# Patient Record
Sex: Female | Born: 1935 | Race: White | Hispanic: No | Smoking: Former smoker
Health system: Southern US, Community
[De-identification: ages and names within clinical notes are randomized; demographics above are authoritative.]

## PROBLEM LIST (undated history)

## (undated) DIAGNOSIS — I482 Chronic atrial fibrillation, unspecified: Secondary | ICD-10-CM

## (undated) DIAGNOSIS — I609 Nontraumatic subarachnoid hemorrhage, unspecified: Secondary | ICD-10-CM

## (undated) DIAGNOSIS — F0390 Unspecified dementia without behavioral disturbance: Secondary | ICD-10-CM

## (undated) DIAGNOSIS — G919 Hydrocephalus, unspecified: Secondary | ICD-10-CM

## (undated) DIAGNOSIS — I7409 Other arterial embolism and thrombosis of abdominal aorta: Secondary | ICD-10-CM

## (undated) DIAGNOSIS — F419 Anxiety disorder, unspecified: Secondary | ICD-10-CM

## (undated) DIAGNOSIS — Z8679 Personal history of other diseases of the circulatory system: Secondary | ICD-10-CM

## (undated) DIAGNOSIS — Z87891 Personal history of nicotine dependence: Secondary | ICD-10-CM

## (undated) DIAGNOSIS — I340 Nonrheumatic mitral (valve) insufficiency: Secondary | ICD-10-CM

## (undated) DIAGNOSIS — K279 Peptic ulcer, site unspecified, unspecified as acute or chronic, without hemorrhage or perforation: Secondary | ICD-10-CM

## (undated) DIAGNOSIS — I519 Heart disease, unspecified: Secondary | ICD-10-CM

## (undated) DIAGNOSIS — E785 Hyperlipidemia, unspecified: Secondary | ICD-10-CM

## (undated) DIAGNOSIS — I350 Nonrheumatic aortic (valve) stenosis: Secondary | ICD-10-CM

## (undated) DIAGNOSIS — G4700 Insomnia, unspecified: Secondary | ICD-10-CM

## (undated) DIAGNOSIS — R410 Disorientation, unspecified: Secondary | ICD-10-CM

## (undated) DIAGNOSIS — R0989 Other specified symptoms and signs involving the circulatory and respiratory systems: Secondary | ICD-10-CM

## (undated) DIAGNOSIS — Z658 Other specified problems related to psychosocial circumstances: Secondary | ICD-10-CM

## (undated) DIAGNOSIS — I10 Essential (primary) hypertension: Secondary | ICD-10-CM

## (undated) DIAGNOSIS — J302 Other seasonal allergic rhinitis: Secondary | ICD-10-CM

## (undated) DIAGNOSIS — M069 Rheumatoid arthritis, unspecified: Secondary | ICD-10-CM

## (undated) HISTORY — DX: Anxiety disorder, unspecified: F41.9

## (undated) HISTORY — PX: PACEMAKER INSERTION: SHX728

## (undated) HISTORY — DX: Chronic atrial fibrillation, unspecified: I48.20

## (undated) HISTORY — DX: Other specified symptoms and signs involving the circulatory and respiratory systems: R09.89

## (undated) HISTORY — DX: Hydrocephalus, unspecified: G91.9

## (undated) HISTORY — DX: Other specified problems related to psychosocial circumstances: Z65.8

## (undated) HISTORY — PX: CATARACT EXTRACTION, BILATERAL: SHX1313

## (undated) HISTORY — PX: ABDOMINAL HYSTERECTOMY: SHX81

## (undated) HISTORY — DX: Hyperlipidemia, unspecified: E78.5

## (undated) HISTORY — DX: Other arterial embolism and thrombosis of abdominal aorta: I74.09

## (undated) HISTORY — DX: Rheumatoid arthritis, unspecified: M06.9

## (undated) HISTORY — DX: Insomnia, unspecified: G47.00

## (undated) HISTORY — DX: Other seasonal allergic rhinitis: J30.2

## (undated) HISTORY — DX: Personal history of nicotine dependence: Z87.891

## (undated) HISTORY — DX: Personal history of other diseases of the circulatory system: Z86.79

## (undated) HISTORY — DX: Essential (primary) hypertension: I10

## (undated) HISTORY — DX: Unspecified dementia, unspecified severity, without behavioral disturbance, psychotic disturbance, mood disturbance, and anxiety: F03.90

## (undated) HISTORY — DX: Heart disease, unspecified: I51.9

## (undated) HISTORY — DX: Nonrheumatic aortic (valve) stenosis: I35.0

## (undated) HISTORY — DX: Nontraumatic subarachnoid hemorrhage, unspecified: I60.9

## (undated) HISTORY — PX: VENTRICULOPERITONEAL SHUNT: SHX204

## (undated) HISTORY — DX: Peptic ulcer, site unspecified, unspecified as acute or chronic, without hemorrhage or perforation: K27.9

## (undated) HISTORY — DX: Disorientation, unspecified: R41.0

## (undated) HISTORY — DX: Nonrheumatic mitral (valve) insufficiency: I34.0

## (undated) HISTORY — PX: TRACHEOSTOMY CLOSURE: SHX458

---
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
## 1997-12-16 ENCOUNTER — Emergency Department (HOSPITAL_COMMUNITY): Admission: EM | Admit: 1997-12-16 | Discharge: 1997-12-16 | Payer: Self-pay | Admitting: Emergency Medicine

## 1999-03-27 ENCOUNTER — Emergency Department (HOSPITAL_COMMUNITY): Admission: EM | Admit: 1999-03-27 | Discharge: 1999-03-27 | Payer: Self-pay

## 1999-04-08 ENCOUNTER — Emergency Department (HOSPITAL_COMMUNITY): Admission: EM | Admit: 1999-04-08 | Discharge: 1999-04-08 | Payer: Self-pay | Admitting: Emergency Medicine

## 1999-04-08 ENCOUNTER — Encounter: Payer: Self-pay | Admitting: Emergency Medicine

## 1999-05-03 ENCOUNTER — Encounter: Admission: RE | Admit: 1999-05-03 | Discharge: 1999-08-01 | Payer: Self-pay | Admitting: Orthopedic Surgery

## 2000-02-07 ENCOUNTER — Encounter: Payer: Self-pay | Admitting: *Deleted

## 2000-02-07 ENCOUNTER — Inpatient Hospital Stay (HOSPITAL_COMMUNITY): Admission: EM | Admit: 2000-02-07 | Discharge: 2000-02-11 | Payer: Self-pay | Admitting: *Deleted

## 2001-02-26 ENCOUNTER — Encounter: Admission: RE | Admit: 2001-02-26 | Discharge: 2001-02-26 | Payer: Self-pay | Admitting: *Deleted

## 2001-02-26 ENCOUNTER — Encounter: Payer: Self-pay | Admitting: *Deleted

## 2001-02-26 ENCOUNTER — Ambulatory Visit (HOSPITAL_COMMUNITY): Admission: RE | Admit: 2001-02-26 | Discharge: 2001-02-26 | Payer: Self-pay | Admitting: *Deleted

## 2001-03-12 ENCOUNTER — Encounter: Payer: Self-pay | Admitting: *Deleted

## 2001-03-12 ENCOUNTER — Ambulatory Visit (HOSPITAL_COMMUNITY): Admission: RE | Admit: 2001-03-12 | Discharge: 2001-03-12 | Payer: Self-pay | Admitting: *Deleted

## 2001-03-12 ENCOUNTER — Encounter: Admission: RE | Admit: 2001-03-12 | Discharge: 2001-03-12 | Payer: Self-pay | Admitting: *Deleted

## 2001-03-25 ENCOUNTER — Encounter: Payer: Self-pay | Admitting: Internal Medicine

## 2001-03-25 ENCOUNTER — Emergency Department (HOSPITAL_COMMUNITY): Admission: EM | Admit: 2001-03-25 | Discharge: 2001-03-25 | Payer: Self-pay | Admitting: Emergency Medicine

## 2001-03-27 ENCOUNTER — Encounter: Admission: RE | Admit: 2001-03-27 | Discharge: 2001-03-27 | Payer: Self-pay | Admitting: *Deleted

## 2001-03-27 ENCOUNTER — Encounter: Payer: Self-pay | Admitting: *Deleted

## 2001-03-27 ENCOUNTER — Ambulatory Visit (HOSPITAL_COMMUNITY): Admission: RE | Admit: 2001-03-27 | Discharge: 2001-03-27 | Payer: Self-pay | Admitting: *Deleted

## 2001-12-27 ENCOUNTER — Ambulatory Visit (HOSPITAL_COMMUNITY): Admission: RE | Admit: 2001-12-27 | Discharge: 2001-12-27 | Payer: Self-pay | Admitting: Internal Medicine

## 2002-09-10 ENCOUNTER — Encounter: Admission: RE | Admit: 2002-09-10 | Discharge: 2002-09-10 | Payer: Self-pay | Admitting: Internal Medicine

## 2002-09-10 ENCOUNTER — Encounter (HOSPITAL_BASED_OUTPATIENT_CLINIC_OR_DEPARTMENT_OTHER): Payer: Self-pay | Admitting: Internal Medicine

## 2002-09-12 DIAGNOSIS — I609 Nontraumatic subarachnoid hemorrhage, unspecified: Secondary | ICD-10-CM

## 2002-09-12 HISTORY — DX: Nontraumatic subarachnoid hemorrhage, unspecified: I60.9

## 2003-03-13 HISTORY — PX: ANEURYSM COILING: SHX5349

## 2003-04-04 ENCOUNTER — Encounter: Payer: Self-pay | Admitting: Emergency Medicine

## 2003-04-04 ENCOUNTER — Encounter: Payer: Self-pay | Admitting: Neurosurgery

## 2003-04-04 ENCOUNTER — Inpatient Hospital Stay (HOSPITAL_COMMUNITY): Admission: EM | Admit: 2003-04-04 | Discharge: 2003-05-09 | Payer: Self-pay | Admitting: Emergency Medicine

## 2003-04-05 ENCOUNTER — Encounter: Payer: Self-pay | Admitting: Critical Care Medicine

## 2003-04-05 ENCOUNTER — Encounter: Payer: Self-pay | Admitting: Neurosurgery

## 2003-04-06 ENCOUNTER — Encounter: Payer: Self-pay | Admitting: Critical Care Medicine

## 2003-04-06 ENCOUNTER — Encounter: Payer: Self-pay | Admitting: Neurosurgery

## 2003-04-07 ENCOUNTER — Encounter: Payer: Self-pay | Admitting: Critical Care Medicine

## 2003-04-07 ENCOUNTER — Encounter: Payer: Self-pay | Admitting: Neurosurgery

## 2003-04-08 ENCOUNTER — Encounter: Payer: Self-pay | Admitting: Critical Care Medicine

## 2003-04-09 ENCOUNTER — Encounter: Payer: Self-pay | Admitting: Pulmonary Disease

## 2003-04-09 ENCOUNTER — Encounter: Payer: Self-pay | Admitting: Neurosurgery

## 2003-04-09 ENCOUNTER — Encounter: Payer: Self-pay | Admitting: Critical Care Medicine

## 2003-04-10 ENCOUNTER — Encounter: Payer: Self-pay | Admitting: Critical Care Medicine

## 2003-04-10 ENCOUNTER — Encounter: Payer: Self-pay | Admitting: Cardiology

## 2003-04-11 ENCOUNTER — Encounter: Payer: Self-pay | Admitting: Critical Care Medicine

## 2003-04-12 ENCOUNTER — Encounter: Payer: Self-pay | Admitting: Critical Care Medicine

## 2003-04-14 ENCOUNTER — Encounter: Payer: Self-pay | Admitting: Neurosurgery

## 2003-04-15 ENCOUNTER — Encounter: Payer: Self-pay | Admitting: Critical Care Medicine

## 2003-04-15 ENCOUNTER — Encounter: Payer: Self-pay | Admitting: Neurosurgery

## 2003-04-16 ENCOUNTER — Encounter: Payer: Self-pay | Admitting: Critical Care Medicine

## 2003-04-16 ENCOUNTER — Encounter: Payer: Self-pay | Admitting: Neurosurgery

## 2003-04-17 ENCOUNTER — Encounter: Payer: Self-pay | Admitting: Critical Care Medicine

## 2003-04-18 ENCOUNTER — Encounter: Payer: Self-pay | Admitting: Neurosurgery

## 2003-04-18 ENCOUNTER — Encounter: Payer: Self-pay | Admitting: Critical Care Medicine

## 2003-04-19 ENCOUNTER — Encounter: Payer: Self-pay | Admitting: Critical Care Medicine

## 2003-04-20 ENCOUNTER — Encounter: Payer: Self-pay | Admitting: Neurosurgery

## 2003-04-22 ENCOUNTER — Encounter: Payer: Self-pay | Admitting: Neurosurgery

## 2003-04-23 ENCOUNTER — Encounter: Payer: Self-pay | Admitting: Neurosurgery

## 2003-04-27 ENCOUNTER — Encounter: Payer: Self-pay | Admitting: Neurosurgery

## 2003-04-30 ENCOUNTER — Encounter: Payer: Self-pay | Admitting: Neurosurgery

## 2003-05-05 ENCOUNTER — Encounter: Payer: Self-pay | Admitting: Neurosurgery

## 2003-05-09 ENCOUNTER — Inpatient Hospital Stay (HOSPITAL_COMMUNITY)
Admission: RE | Admit: 2003-05-09 | Discharge: 2003-05-21 | Payer: Self-pay | Admitting: Physical Medicine & Rehabilitation

## 2003-05-24 ENCOUNTER — Encounter: Payer: Self-pay | Admitting: Emergency Medicine

## 2003-05-24 ENCOUNTER — Encounter: Payer: Self-pay | Admitting: Internal Medicine

## 2003-05-24 ENCOUNTER — Emergency Department (HOSPITAL_COMMUNITY): Admission: AD | Admit: 2003-05-24 | Discharge: 2003-05-24 | Payer: Self-pay | Admitting: Emergency Medicine

## 2003-06-17 ENCOUNTER — Encounter: Payer: Self-pay | Admitting: Neurosurgery

## 2003-06-17 ENCOUNTER — Ambulatory Visit (HOSPITAL_COMMUNITY): Admission: RE | Admit: 2003-06-17 | Discharge: 2003-06-17 | Payer: Self-pay | Admitting: Neurosurgery

## 2003-06-20 ENCOUNTER — Ambulatory Visit (HOSPITAL_COMMUNITY): Admission: RE | Admit: 2003-06-20 | Discharge: 2003-06-20 | Payer: Self-pay | Admitting: Neurosurgery

## 2003-06-20 ENCOUNTER — Encounter: Payer: Self-pay | Admitting: Neurosurgery

## 2003-07-01 ENCOUNTER — Encounter: Payer: Self-pay | Admitting: Neurosurgery

## 2003-07-03 ENCOUNTER — Inpatient Hospital Stay (HOSPITAL_COMMUNITY): Admission: RE | Admit: 2003-07-03 | Discharge: 2003-07-07 | Payer: Self-pay | Admitting: Neurosurgery

## 2003-07-03 ENCOUNTER — Encounter: Payer: Self-pay | Admitting: Neurosurgery

## 2003-07-04 ENCOUNTER — Encounter: Payer: Self-pay | Admitting: Neurosurgery

## 2003-07-07 ENCOUNTER — Encounter: Payer: Self-pay | Admitting: Neurosurgery

## 2003-07-17 ENCOUNTER — Ambulatory Visit (HOSPITAL_COMMUNITY): Admission: RE | Admit: 2003-07-17 | Discharge: 2003-07-17 | Payer: Self-pay | Admitting: Neurosurgery

## 2003-07-25 ENCOUNTER — Ambulatory Visit (HOSPITAL_COMMUNITY): Admission: RE | Admit: 2003-07-25 | Discharge: 2003-07-25 | Payer: Self-pay | Admitting: Neurosurgery

## 2003-08-01 ENCOUNTER — Ambulatory Visit (HOSPITAL_COMMUNITY): Admission: RE | Admit: 2003-08-01 | Discharge: 2003-08-01 | Payer: Self-pay | Admitting: Neurosurgery

## 2003-08-12 ENCOUNTER — Ambulatory Visit (HOSPITAL_COMMUNITY): Admission: RE | Admit: 2003-08-12 | Discharge: 2003-08-12 | Payer: Self-pay | Admitting: Neurosurgery

## 2003-09-19 ENCOUNTER — Ambulatory Visit (HOSPITAL_COMMUNITY): Admission: RE | Admit: 2003-09-19 | Discharge: 2003-09-19 | Payer: Self-pay | Admitting: Neurosurgery

## 2003-12-16 ENCOUNTER — Ambulatory Visit (HOSPITAL_COMMUNITY): Admission: RE | Admit: 2003-12-16 | Discharge: 2003-12-16 | Payer: Self-pay | Admitting: Neurosurgery

## 2007-05-21 ENCOUNTER — Emergency Department (HOSPITAL_COMMUNITY): Admission: EM | Admit: 2007-05-21 | Discharge: 2007-05-21 | Payer: Self-pay | Admitting: Emergency Medicine

## 2007-08-21 ENCOUNTER — Encounter: Admission: RE | Admit: 2007-08-21 | Discharge: 2007-08-21 | Payer: Self-pay | Admitting: Internal Medicine

## 2007-09-08 ENCOUNTER — Encounter: Payer: Self-pay | Admitting: Emergency Medicine

## 2007-09-08 ENCOUNTER — Inpatient Hospital Stay (HOSPITAL_COMMUNITY): Admission: EM | Admit: 2007-09-08 | Discharge: 2007-09-14 | Payer: Self-pay | Admitting: Emergency Medicine

## 2007-10-04 ENCOUNTER — Encounter: Admission: RE | Admit: 2007-10-04 | Discharge: 2007-10-04 | Payer: Self-pay | Admitting: Neurosurgery

## 2008-04-15 ENCOUNTER — Ambulatory Visit: Payer: Self-pay | Admitting: Vascular Surgery

## 2008-04-20 ENCOUNTER — Inpatient Hospital Stay (HOSPITAL_COMMUNITY): Admission: EM | Admit: 2008-04-20 | Discharge: 2008-04-23 | Payer: Self-pay | Admitting: Emergency Medicine

## 2008-04-22 ENCOUNTER — Ambulatory Visit: Payer: Self-pay | Admitting: Vascular Surgery

## 2008-04-22 ENCOUNTER — Encounter (HOSPITAL_BASED_OUTPATIENT_CLINIC_OR_DEPARTMENT_OTHER): Payer: Self-pay | Admitting: Internal Medicine

## 2008-05-13 HISTORY — PX: HEMIARTHROPLASTY HIP: SUR652

## 2008-05-25 ENCOUNTER — Inpatient Hospital Stay (HOSPITAL_COMMUNITY): Admission: EM | Admit: 2008-05-25 | Discharge: 2008-06-02 | Payer: Self-pay | Admitting: Emergency Medicine

## 2008-07-12 ENCOUNTER — Inpatient Hospital Stay (HOSPITAL_COMMUNITY): Admission: EM | Admit: 2008-07-12 | Discharge: 2008-07-17 | Payer: Self-pay | Admitting: Emergency Medicine

## 2008-07-12 ENCOUNTER — Ambulatory Visit: Payer: Self-pay | Admitting: Cardiology

## 2008-09-23 ENCOUNTER — Encounter: Admission: RE | Admit: 2008-09-23 | Discharge: 2008-12-22 | Payer: Self-pay | Admitting: Orthopaedic Surgery

## 2008-09-29 ENCOUNTER — Inpatient Hospital Stay (HOSPITAL_COMMUNITY): Admission: EM | Admit: 2008-09-29 | Discharge: 2008-10-10 | Payer: Self-pay | Admitting: Emergency Medicine

## 2008-10-03 ENCOUNTER — Encounter: Payer: Self-pay | Admitting: Gastroenterology

## 2008-10-03 ENCOUNTER — Encounter (HOSPITAL_BASED_OUTPATIENT_CLINIC_OR_DEPARTMENT_OTHER): Payer: Self-pay | Admitting: Internal Medicine

## 2008-12-29 IMAGING — CR DG HIP (WITH OR WITHOUT PELVIS) 2-3V*L*
4 series · 4 of 4 positions shown · non-contrast
Comparison: 

CLINICAL DATA: Fall

LEFT HIP - COMPLETE 2+ VIEW

[t pelvis a.p.]
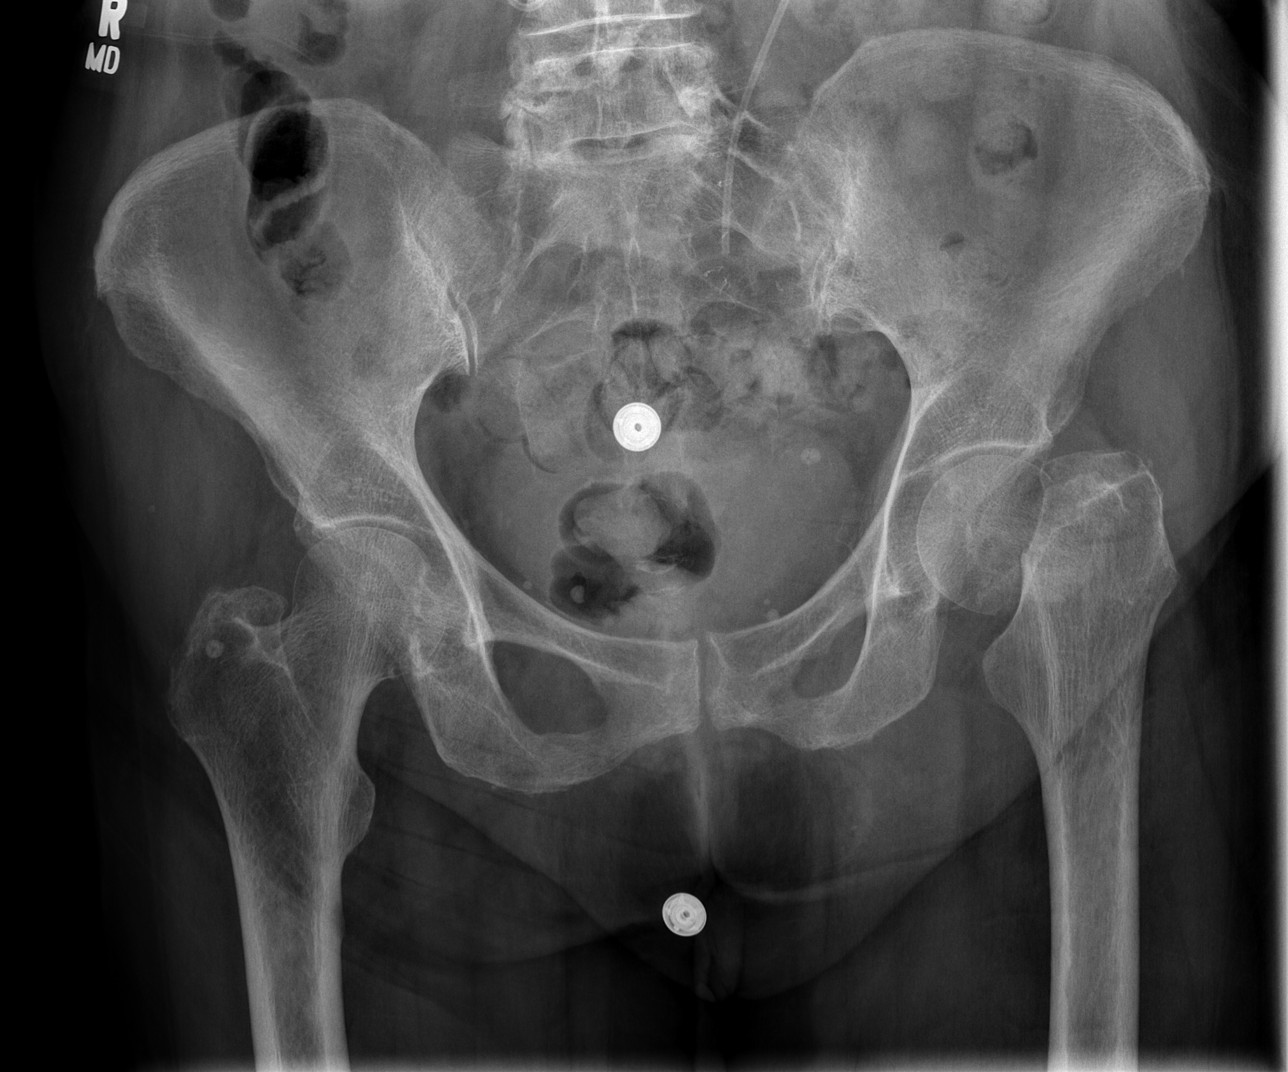

[t hip ap left]
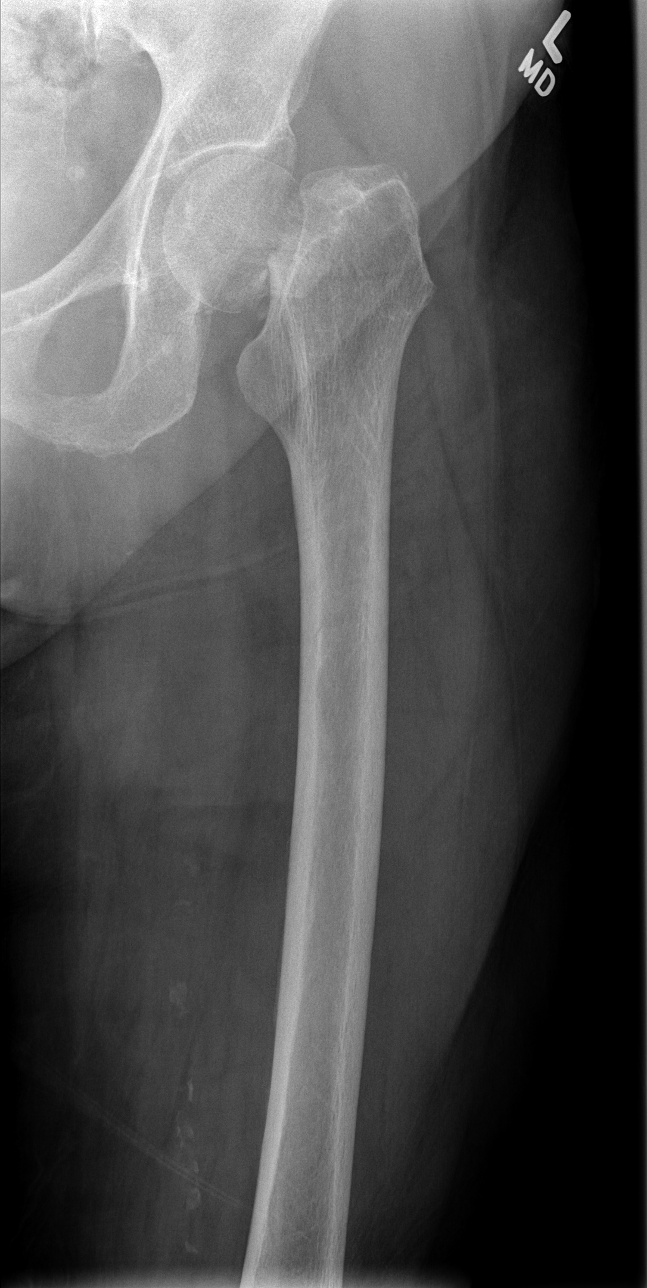

[w hip lateral left * (1 of 2)]
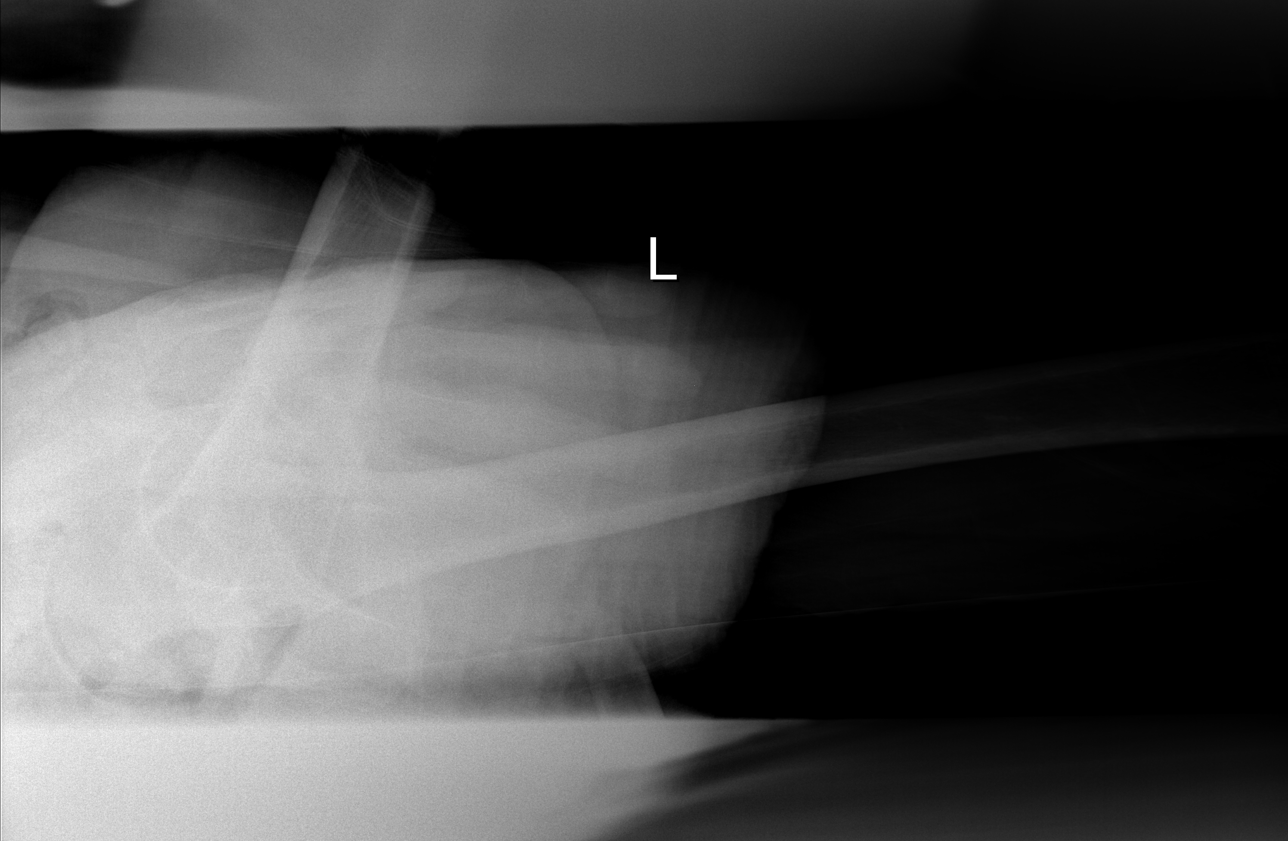

[w hip lateral left * (2 of 2)]
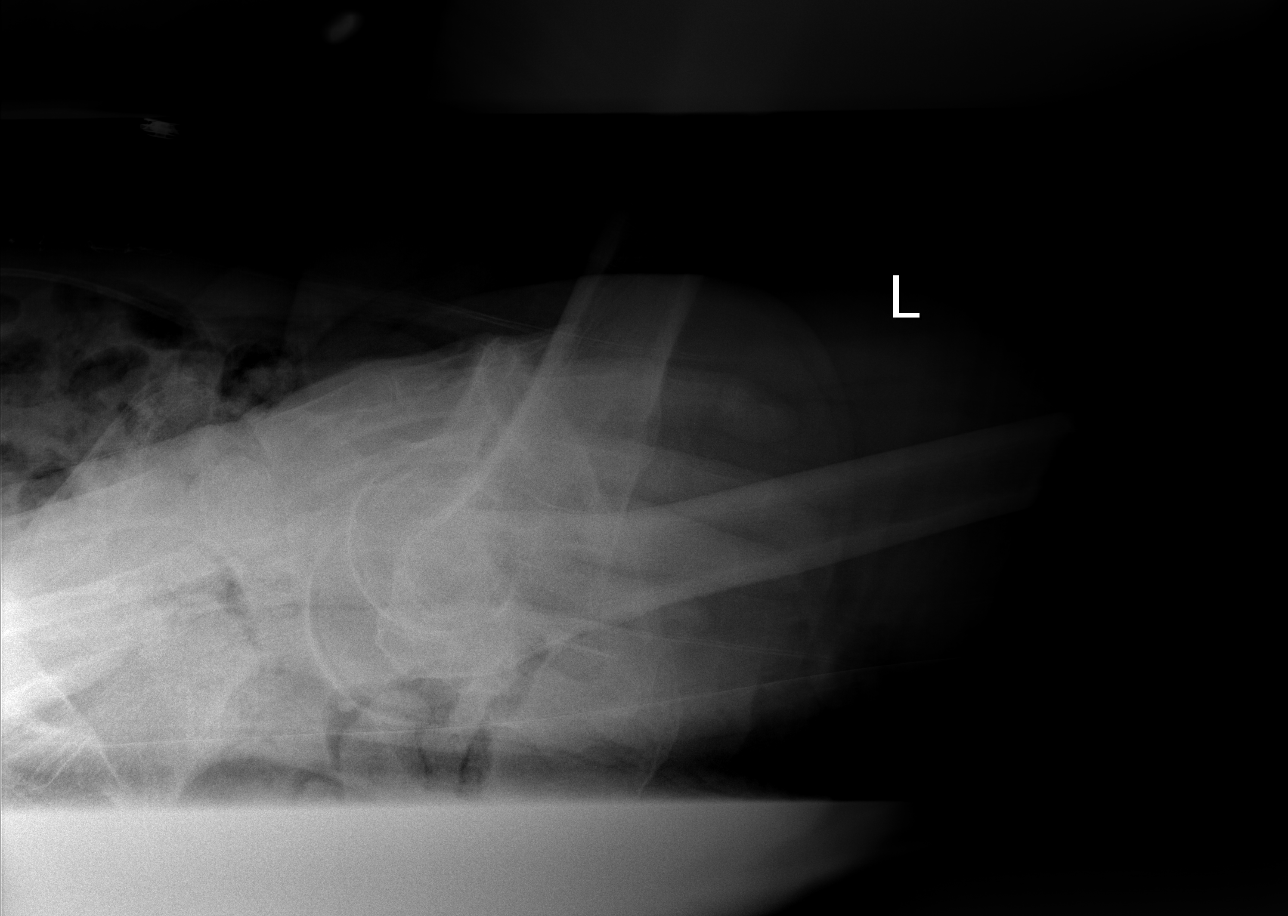

[4 of 4 positions shown; findings below may reference images not displayed]

FINDINGS: Left femoral neck fracture with varus deformity is noted.
The right femoral neck is intact.  Bones are mildly demineralized.
There is anatomic alignment of the femoral head with respect to the
acetabulum.
IMPRESSION: Acute left femoral neck fracture.

## 2009-05-24 ENCOUNTER — Inpatient Hospital Stay (HOSPITAL_COMMUNITY): Admission: EM | Admit: 2009-05-24 | Discharge: 2009-05-29 | Payer: Self-pay | Admitting: Emergency Medicine

## 2009-05-29 ENCOUNTER — Encounter (HOSPITAL_BASED_OUTPATIENT_CLINIC_OR_DEPARTMENT_OTHER): Payer: Self-pay | Admitting: Internal Medicine

## 2010-03-14 ENCOUNTER — Emergency Department (HOSPITAL_COMMUNITY): Admission: EM | Admit: 2010-03-14 | Discharge: 2010-03-14 | Payer: Self-pay | Admitting: Emergency Medicine

## 2010-12-17 LAB — COMPREHENSIVE METABOLIC PANEL
ALT: 9 U/L (ref 0–35)
ALT: 9 U/L (ref 0–35)
ALT: 9 U/L (ref 0–35)
AST: 14 U/L (ref 0–37)
AST: 20 U/L (ref 0–37)
AST: 24 U/L (ref 0–37)
Albumin: 3 g/dL — ABNORMAL LOW (ref 3.5–5.2)
Albumin: 3.8 g/dL (ref 3.5–5.2)
Alkaline Phosphatase: 67 U/L (ref 39–117)
BUN: 13 mg/dL (ref 6–23)
CO2: 28 mEq/L (ref 19–32)
CO2: 29 mEq/L (ref 19–32)
CO2: 30 mEq/L (ref 19–32)
Calcium: 8.9 mg/dL (ref 8.4–10.5)
Calcium: 9.2 mg/dL (ref 8.4–10.5)
Chloride: 100 mEq/L (ref 96–112)
Chloride: 101 mEq/L (ref 96–112)
Creatinine, Ser: 1.21 mg/dL — ABNORMAL HIGH (ref 0.4–1.2)
Creatinine, Ser: 1.26 mg/dL — ABNORMAL HIGH (ref 0.4–1.2)
GFR calc Af Amer: 45 mL/min — ABNORMAL LOW (ref >60)
GFR calc Af Amer: 47 mL/min — ABNORMAL LOW (ref >60)
GFR calc Af Amer: 49 mL/min — ABNORMAL LOW (ref >60)
GFR calc Af Amer: 51 mL/min — ABNORMAL LOW (ref >60)
GFR calc non Af Amer: 37 mL/min — ABNORMAL LOW (ref >60)
GFR calc non Af Amer: 39 mL/min — ABNORMAL LOW (ref >60)
Glucose, Bld: 83 mg/dL (ref 70–99)
Potassium: 3.4 mEq/L — ABNORMAL LOW (ref 3.5–5.1)
Potassium: 3.5 mEq/L (ref 3.5–5.1)
Sodium: 137 mEq/L (ref 135–145)
Sodium: 137 mEq/L (ref 135–145)
Sodium: 138 mEq/L (ref 135–145)
Sodium: 142 mEq/L (ref 135–145)
Total Bilirubin: 1.3 mg/dL — ABNORMAL HIGH (ref 0.3–1.2)
Total Bilirubin: 1.6 mg/dL — ABNORMAL HIGH (ref 0.3–1.2)
Total Protein: 6.4 g/dL (ref 6.0–8.3)

## 2010-12-17 LAB — CBC
HCT: 42.3 % (ref 36.0–46.0)
Hemoglobin: 14.1 g/dL (ref 12.0–15.0)
Hemoglobin: 12.9 g/dL (ref 12.0–15.0)
MCHC: 33.3 g/dL (ref 30.0–36.0)
MCHC: 33 g/dL (ref 30.0–36.0)
MCV: 93.3 fL (ref 78.0–100.0)
MCV: 94.4 fL (ref 78.0–100.0)
Platelets: 217 10*3/uL (ref 150–400)
Platelets: 250 10*3/uL (ref 150–400)
RBC: 4.06 MIL/uL (ref 3.87–5.11)
RBC: 4.08 MIL/uL (ref 3.87–5.11)
RBC: 4.18 MIL/uL (ref 3.87–5.11)
RBC: 4.81 MIL/uL (ref 3.87–5.11)
RDW: 14.1 % (ref 11.5–15.5)
WBC: 11.1 10*3/uL — ABNORMAL HIGH (ref 4.0–10.5)
WBC: 11.1 10*3/uL — ABNORMAL HIGH (ref 4.0–10.5)
WBC: 7.4 10*3/uL (ref 4.0–10.5)
WBC: 7.6 10*3/uL (ref 4.0–10.5)

## 2010-12-17 LAB — DIFFERENTIAL
Basophils Absolute: 0 10*3/uL (ref 0.0–0.1)
Basophils Absolute: 0 10*3/uL (ref 0.0–0.1)
Basophils Absolute: 0.1 10*3/uL (ref 0.0–0.1)
Basophils Relative: 0 % (ref 0–1)
Basophils Relative: 0 % (ref 0–1)
Eosinophils Absolute: 0 10*3/uL (ref 0.0–0.7)
Eosinophils Absolute: 0 10*3/uL (ref 0.0–0.7)
Eosinophils Absolute: 0.1 10*3/uL (ref 0.0–0.7)
Eosinophils Relative: 0 % (ref 0–5)
Eosinophils Relative: 0 % (ref 0–5)
Eosinophils Relative: 0 % (ref 0–5)
Eosinophils Relative: 1 % (ref 0–5)
Lymphocytes Relative: 8 % — ABNORMAL LOW (ref 12–46)
Lymphocytes Relative: 21 % (ref 12–46)
Lymphocytes Relative: 8 % — ABNORMAL LOW (ref 12–46)
Lymphs Abs: 1.4 10*3/uL (ref 0.7–4.0)
Lymphs Abs: 1.6 10*3/uL (ref 0.7–4.0)
Monocytes Absolute: 0.2 10*3/uL (ref 0.1–1.0)
Monocytes Absolute: 0.4 10*3/uL (ref 0.1–1.0)
Monocytes Absolute: 0.8 10*3/uL (ref 0.1–1.0)
Monocytes Relative: 11 % (ref 3–12)
Monocytes Relative: 12 % (ref 3–12)
Neutro Abs: 7.6 10*3/uL (ref 1.7–7.7)
Neutro Abs: 4.7 10*3/uL (ref 1.7–7.7)
Neutrophils Relative %: 64 % (ref 43–77)

## 2010-12-17 LAB — URINALYSIS, ROUTINE W REFLEX MICROSCOPIC
Bilirubin Urine: NEGATIVE
Hgb urine dipstick: NEGATIVE
Nitrite: NEGATIVE
Protein, ur: 100 mg/dL — AB
Urobilinogen, UA: 1 mg/dL (ref 0.0–1.0)

## 2010-12-17 LAB — URINE CULTURE
Colony Count: NO GROWTH
Culture: NO GROWTH

## 2010-12-17 LAB — TROPONIN I: Troponin I: 0.07 ng/mL — ABNORMAL HIGH (ref 0.00–0.06)

## 2010-12-17 LAB — CARDIAC PANEL(CRET KIN+CKTOT+MB+TROPI)
CK, MB: 2 ng/mL (ref 0.3–4.0)
Relative Index: INVALID (ref 0.0–2.5)
Total CK: 37 U/L (ref 7–177)

## 2010-12-17 LAB — APTT: aPTT: 30 seconds (ref 24–37)

## 2010-12-17 LAB — BRAIN NATRIURETIC PEPTIDE
Pro B Natriuretic peptide (BNP): 1481 pg/mL — ABNORMAL HIGH (ref 0.0–100.0)
Pro B Natriuretic peptide (BNP): 1856 pg/mL — ABNORMAL HIGH (ref 0.0–100.0)

## 2010-12-27 LAB — DIFFERENTIAL
Basophils Absolute: 0 10*3/uL (ref 0.0–0.1)
Basophils Absolute: 0 10*3/uL (ref 0.0–0.1)
Basophils Absolute: 0 10*3/uL (ref 0.0–0.1)
Basophils Relative: 0 % (ref 0–1)
Basophils Relative: 0 % (ref 0–1)
Basophils Relative: 1 % (ref 0–1)
Eosinophils Absolute: 0 10*3/uL (ref 0.0–0.7)
Eosinophils Absolute: 0.1 10*3/uL (ref 0.0–0.7)
Eosinophils Absolute: 0.1 10*3/uL (ref 0.0–0.7)
Eosinophils Absolute: 0.1 10*3/uL (ref 0.0–0.7)
Eosinophils Absolute: 0.1 10*3/uL (ref 0.0–0.7)
Eosinophils Relative: 1 % (ref 0–5)
Eosinophils Relative: 1 % (ref 0–5)
Eosinophils Relative: 2 % (ref 0–5)
Eosinophils Relative: 2 % (ref 0–5)
Lymphocytes Relative: 11 % — ABNORMAL LOW (ref 12–46)
Lymphocytes Relative: 14 % (ref 12–46)
Lymphocytes Relative: 15 % (ref 12–46)
Lymphocytes Relative: 17 % (ref 12–46)
Lymphocytes Relative: 18 % (ref 12–46)
Lymphs Abs: 1 10*3/uL (ref 0.7–4.0)
Lymphs Abs: 1 10*3/uL (ref 0.7–4.0)
Lymphs Abs: 1.3 10*3/uL (ref 0.7–4.0)
Lymphs Abs: 1.5 10*3/uL (ref 0.7–4.0)
Lymphs Abs: 1.6 10*3/uL (ref 0.7–4.0)
Monocytes Absolute: 0.8 10*3/uL (ref 0.1–1.0)
Monocytes Absolute: 0.9 10*3/uL (ref 0.1–1.0)
Monocytes Relative: 11 % (ref 3–12)
Monocytes Relative: 11 % (ref 3–12)
Neutro Abs: 4.5 10*3/uL (ref 1.7–7.7)
Neutro Abs: 4.9 10*3/uL (ref 1.7–7.7)
Neutro Abs: 6.5 10*3/uL (ref 1.7–7.7)
Neutrophils Relative %: 67 % (ref 43–77)
Neutrophils Relative %: 69 % (ref 43–77)
Neutrophils Relative %: 72 % (ref 43–77)
Neutrophils Relative %: 75 % (ref 43–77)
Neutrophils Relative %: 79 % — ABNORMAL HIGH (ref 43–77)

## 2010-12-27 LAB — COMPREHENSIVE METABOLIC PANEL
ALT: 10 U/L (ref 0–35)
ALT: 13 U/L (ref 0–35)
ALT: 14 U/L (ref 0–35)
ALT: 8 U/L (ref 0–35)
ALT: 8 U/L (ref 0–35)
AST: 11 U/L (ref 0–37)
AST: 13 U/L (ref 0–37)
AST: 17 U/L (ref 0–37)
AST: 20 U/L (ref 0–37)
Albumin: 2.8 g/dL — ABNORMAL LOW (ref 3.5–5.2)
Albumin: 2.9 g/dL — ABNORMAL LOW (ref 3.5–5.2)
Alkaline Phosphatase: 59 U/L (ref 39–117)
Alkaline Phosphatase: 72 U/L (ref 39–117)
Alkaline Phosphatase: 73 U/L (ref 39–117)
BUN: 38 mg/dL — ABNORMAL HIGH (ref 6–23)
BUN: 41 mg/dL — ABNORMAL HIGH (ref 6–23)
BUN: 46 mg/dL — ABNORMAL HIGH (ref 6–23)
CO2: 23 mEq/L (ref 19–32)
CO2: 24 mEq/L (ref 19–32)
CO2: 25 mEq/L (ref 19–32)
CO2: 26 mEq/L (ref 19–32)
CO2: 27 mEq/L (ref 19–32)
Calcium: 8.4 mg/dL (ref 8.4–10.5)
Calcium: 8.6 mg/dL (ref 8.4–10.5)
Calcium: 8.7 mg/dL (ref 8.4–10.5)
Calcium: 8.7 mg/dL (ref 8.4–10.5)
Calcium: 9 mg/dL (ref 8.4–10.5)
Chloride: 100 mEq/L (ref 96–112)
Chloride: 103 mEq/L (ref 96–112)
Chloride: 103 mEq/L (ref 96–112)
Chloride: 104 mEq/L (ref 96–112)
Chloride: 106 mEq/L (ref 96–112)
Creatinine, Ser: 3.26 mg/dL — ABNORMAL HIGH (ref 0.4–1.2)
Creatinine, Ser: 3.35 mg/dL — ABNORMAL HIGH (ref 0.4–1.2)
GFR calc Af Amer: 13 mL/min — ABNORMAL LOW (ref >60)
GFR calc Af Amer: 15 mL/min — ABNORMAL LOW (ref >60)
GFR calc Af Amer: 16 mL/min — ABNORMAL LOW (ref >60)
GFR calc Af Amer: 19 mL/min — ABNORMAL LOW (ref >60)
GFR calc non Af Amer: 11 mL/min — ABNORMAL LOW (ref >60)
GFR calc non Af Amer: 12 mL/min — ABNORMAL LOW (ref >60)
GFR calc non Af Amer: 13 mL/min — ABNORMAL LOW (ref >60)
GFR calc non Af Amer: 15 mL/min — ABNORMAL LOW (ref >60)
GFR calc non Af Amer: 16 mL/min — ABNORMAL LOW (ref >60)
Glucose, Bld: 100 mg/dL — ABNORMAL HIGH (ref 70–99)
Glucose, Bld: 103 mg/dL — ABNORMAL HIGH (ref 70–99)
Glucose, Bld: 90 mg/dL (ref 70–99)
Potassium: 3.5 mEq/L (ref 3.5–5.1)
Potassium: 3.5 mEq/L (ref 3.5–5.1)
Potassium: 3.7 mEq/L (ref 3.5–5.1)
Sodium: 135 mEq/L (ref 135–145)
Sodium: 138 mEq/L (ref 135–145)
Sodium: 138 mEq/L (ref 135–145)
Sodium: 140 mEq/L (ref 135–145)
Sodium: 140 mEq/L (ref 135–145)
Total Bilirubin: 0.6 mg/dL (ref 0.3–1.2)
Total Bilirubin: 0.8 mg/dL (ref 0.3–1.2)
Total Bilirubin: 1 mg/dL (ref 0.3–1.2)
Total Protein: 5.8 g/dL — ABNORMAL LOW (ref 6.0–8.3)
Total Protein: 6 g/dL (ref 6.0–8.3)

## 2010-12-27 LAB — CBC
HCT: 32.2 % — ABNORMAL LOW (ref 36.0–46.0)
HCT: 33.1 % — ABNORMAL LOW (ref 36.0–46.0)
HCT: 36.6 % (ref 36.0–46.0)
Hemoglobin: 10.3 g/dL — ABNORMAL LOW (ref 12.0–15.0)
Hemoglobin: 10.6 g/dL — ABNORMAL LOW (ref 12.0–15.0)
Hemoglobin: 11 g/dL — ABNORMAL LOW (ref 12.0–15.0)
Hemoglobin: 11.7 g/dL — ABNORMAL LOW (ref 12.0–15.0)
MCHC: 32.5 g/dL (ref 30.0–36.0)
MCHC: 32.6 g/dL (ref 30.0–36.0)
MCHC: 32.9 g/dL (ref 30.0–36.0)
MCHC: 33 g/dL (ref 30.0–36.0)
MCHC: 33 g/dL (ref 30.0–36.0)
MCHC: 33.4 g/dL (ref 30.0–36.0)
MCV: 96.3 fL (ref 78.0–100.0)
MCV: 97.1 fL (ref 78.0–100.0)
MCV: 98.2 fL (ref 78.0–100.0)
Platelets: 275 10*3/uL (ref 150–400)
Platelets: 283 10*3/uL (ref 150–400)
Platelets: 290 10*3/uL (ref 150–400)
Platelets: 324 10*3/uL (ref 150–400)
Platelets: 355 10*3/uL (ref 150–400)
RBC: 3.22 MIL/uL — ABNORMAL LOW (ref 3.87–5.11)
RBC: 3.35 MIL/uL — ABNORMAL LOW (ref 3.87–5.11)
RBC: 3.49 MIL/uL — ABNORMAL LOW (ref 3.87–5.11)
RBC: 3.56 MIL/uL — ABNORMAL LOW (ref 3.87–5.11)
RBC: 3.8 MIL/uL — ABNORMAL LOW (ref 3.87–5.11)
RDW: 14.6 % (ref 11.5–15.5)
RDW: 14.8 % (ref 11.5–15.5)
RDW: 15 % (ref 11.5–15.5)
WBC: 6.4 10*3/uL (ref 4.0–10.5)
WBC: 7 10*3/uL (ref 4.0–10.5)
WBC: 7.1 10*3/uL (ref 4.0–10.5)
WBC: 8.3 10*3/uL (ref 4.0–10.5)
WBC: 8.3 10*3/uL (ref 4.0–10.5)
WBC: 8.3 10*3/uL (ref 4.0–10.5)
WBC: 8.7 10*3/uL (ref 4.0–10.5)

## 2010-12-27 LAB — BASIC METABOLIC PANEL
BUN: 36 mg/dL — ABNORMAL HIGH (ref 6–23)
BUN: 36 mg/dL — ABNORMAL HIGH (ref 6–23)
BUN: 36 mg/dL — ABNORMAL HIGH (ref 6–23)
Calcium: 8.8 mg/dL (ref 8.4–10.5)
Chloride: 104 mEq/L (ref 96–112)
Creatinine, Ser: 3.54 mg/dL — ABNORMAL HIGH (ref 0.4–1.2)
Creatinine, Ser: 4.05 mg/dL — ABNORMAL HIGH (ref 0.4–1.2)
GFR calc non Af Amer: 10 mL/min — ABNORMAL LOW (ref >60)
GFR calc non Af Amer: 11 mL/min — ABNORMAL LOW (ref >60)
GFR calc non Af Amer: 12 mL/min — ABNORMAL LOW (ref >60)
Potassium: 4 mEq/L (ref 3.5–5.1)
Potassium: 4.3 mEq/L (ref 3.5–5.1)

## 2010-12-27 LAB — RENAL FUNCTION PANEL
Albumin: 2.6 g/dL — ABNORMAL LOW (ref 3.5–5.2)
Albumin: 2.8 g/dL — ABNORMAL LOW (ref 3.5–5.2)
BUN: 40 mg/dL — ABNORMAL HIGH (ref 6–23)
Calcium: 8.8 mg/dL (ref 8.4–10.5)
Chloride: 108 mEq/L (ref 96–112)
Creatinine, Ser: 4.13 mg/dL — ABNORMAL HIGH (ref 0.4–1.2)
GFR calc Af Amer: 12 mL/min — ABNORMAL LOW (ref >60)
GFR calc non Af Amer: 10 mL/min — ABNORMAL LOW (ref >60)
Phosphorus: 4.9 mg/dL — ABNORMAL HIGH (ref 2.3–4.6)
Phosphorus: 5.1 mg/dL — ABNORMAL HIGH (ref 2.3–4.6)
Potassium: 3.5 mEq/L (ref 3.5–5.1)
Potassium: 4.3 mEq/L (ref 3.5–5.1)
Sodium: 140 mEq/L (ref 135–145)

## 2010-12-27 LAB — URINALYSIS, ROUTINE W REFLEX MICROSCOPIC
Glucose, UA: NEGATIVE mg/dL
Protein, ur: 30 mg/dL — AB
Specific Gravity, Urine: 1.009 (ref 1.005–1.030)

## 2010-12-27 LAB — PROTEIN, URINE, 24 HOUR
Collection Interval-UPROT: 24 hours
Protein, 24H Urine: 144 mg/d — ABNORMAL HIGH (ref 50–100)

## 2010-12-27 LAB — PROTEIN ELECTROPH W RFLX QUANT IMMUNOGLOBULINS
Albumin ELP: 47.1 % — ABNORMAL LOW (ref 55.8–66.1)
Beta 2: 5.7 % (ref 3.2–6.5)
Gamma Globulin: 19.1 % — ABNORMAL HIGH (ref 11.1–18.8)
M-Spike, %: NOT DETECTED g/dL

## 2010-12-27 LAB — POCT CARDIAC MARKERS
CKMB, poc: 3.1 ng/mL (ref 1.0–8.0)
Myoglobin, poc: 407 ng/mL (ref 12–200)
Troponin i, poc: <0.05 ng/mL (ref 0.00–0.09)

## 2010-12-27 LAB — URINE CULTURE

## 2010-12-27 LAB — URINE MICROSCOPIC-ADD ON

## 2010-12-27 LAB — BRAIN NATRIURETIC PEPTIDE
Pro B Natriuretic peptide (BNP): 1150 pg/mL — ABNORMAL HIGH (ref 0.0–100.0)
Pro B Natriuretic peptide (BNP): 2080 pg/mL — ABNORMAL HIGH (ref 0.0–100.0)
Pro B Natriuretic peptide (BNP): 3190 pg/mL — ABNORMAL HIGH (ref 0.0–100.0)

## 2010-12-27 LAB — AMMONIA: Ammonia: 17 umol/L (ref 11–35)

## 2011-01-25 NOTE — Consult Note (Signed)
[name], [name]               ACCOUNT NO.:  [id number]   MEDICAL RECORD NO.:  [id number]          PATIENT TYPE:  INP   LOCATION:  1512                         FACILITY:  [facility]   PHYSICIAN:  Antonietta Breach, M.D.  DATE OF BIRTH:  24-Aug-1921   DATE OF CONSULTATION:  10/03/2008  DATE OF DISCHARGE:                                 CONSULTATION   FOLLOWUP:  The case was discussed with her attending physician and the  staff.   [name] has continued with a pleasant disposition.  She continues  with impaired memory, impaired judgment, but she is oriented to person.   She is not agitated or combative.  She is sitting up in her hospital  chair at the nursing station.   REVIEW OF SYSTEMS:  NEUROLOGIC:  She is not displaying any stiffness or  other extrapyramidal side effects of the Haldol.   Also her QTC, rechecked yesterday with EKG, with was 485 milliseconds.   LABORATORY DATA:  Her sodium today is 139, BUN 38, creatinine 4.13,  glucose 103. WBC is 7.0, hemoglobin 11, platelet count 290.   She does continue with chronic insomnia.   EXAMINATION:  VITAL SIGNS:  Temperature 97.2, pulse 70, respiratory rate  16, blood pressure 162/70, oxygen saturation on room air 96%.   MENTAL STATUS EXAM:  [name] is sitting up in her hospital chair  with a soft waist belt.  She engages with intact eye contact.  She has  impaired judgment, poor insight.  Her affect is pleasant.  Her mood is  pleasant.  She is oriented to self.  Her memory continues to be  impaired.  Her speech involves normal rate and prosody without  dysarthria.  Thought process involves confabulation.  Thought content:  No impulses to harm self or others.  She is not having any current  hallucinations or paranoia.   As mentioned, insight and judgment are impaired.  She is alert.   ASSESSMENT:  AXIS I:  293.00 Delirium, not otherwise specified.  This is  now controlled with her baseline of dementia present.   293.84   Anxiety disorder, not otherwise specified, with a history of  chronic insomnia.   RECOMMENDATIONS:  Would start trazodone at 25 mg q.h.s., then would  increase by 25 mg per evening based upon the previous night's sleep  pattern, not to exceed 100 mg q.h.s.   Would exercise caution, as discussed, regarding orthostatic hypotension.  Would also monitor for any nausea or loose stools which are associated  with the SRI component of trazodone.   Would continue her Xanax 0.5 mg q.h.s.  However, would change that to  p.r.n. and discontinue it once the trazodone is effective.   No change in Lexapro 10 mg daily for antianxiety.   Would not taper off the Haldol until she is comfortably placed in her  placement facility and then would taper off as a trial over the first  month.      Antonietta Breach, M.D.  Electronically Signed     JW/MEDQ  D:  10/03/2008  T:  10/03/2008  Job:  [id number]

## 2011-01-25 NOTE — Op Note (Signed)
[name], [name]               ACCOUNT NO.:  [id number]   MEDICAL RECORD NO.:  [id number]          PATIENT TYPE:  INP   LOCATION:  1602                         FACILITY:  [facility]   PHYSICIAN:  Vanita Panda. Magnus Ivan, M.D.DATE OF BIRTH:  08/28/1921   DATE OF PROCEDURE:  05/25/2008  DATE OF DISCHARGE:                               OPERATIVE REPORT   PREOPERATIVE DIAGNOSIS:  Left hip femoral neck fracture.   POSTOPERATIVE DIAGNOSIS:  Left hip femoral neck fracture.   PROCEDURE:  Left hip hemiarthroplasty.   IMPLANTS:  DePuy Summit basic Press-Fit stem size three, size 45  unipolar head, -3 spacer.   SURGEON:  Doneen Poisson, M.D.   ANESTHESIA:  General.   ANTIBIOTICS:  One gram IV Ancef.   BLOOD LOSS:  800 mL.   COMPLICATIONS:  None.   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  She sustained a mechanical  fall earlier this morning in her bathroom.  Her kids found her.  She  denies any loss of consciousness or any other problems.  She just had a  mechanical fall and she could not get to a phone.  She is brought to the  [facility] Emergency Room and found to have a left hip fracture.  She  was deemed a moderate risk for surgery and I did talk to her primary  care physician.  He felt that I could proceed with surgery, that there  was nothing else that he would do to optimize her perioperatively.  The  risks and benefits of surgery were explained to her in length including  her family and they all agreed to proceed with surgery.   PROCEDURE:  After informed consent obtained, the appropriate left hip  was marked.  She was  brought to the operating room and placed supine on  the operating table.  General anesthesia was then obtained. She was then  turned into a lateral decubitus position with the left operative hip up.  Her left hip was then prepped and draped with DuraPrep and sterile  drapes  including sterile stockinette.  A time-out was called and she was  identified as the correct patient, left hip.   We then made an incision directly over the greater trochanter and  carried this proximally and distally.  I dissected down to the  iliotibial band.  The iliotibial band was divided in longitudinal  fashion and a Charnley retractor was placed.  I then proceeded with an  anterior lateral approach to the hip.  The gluteus medius and minimus  was taken off approximately 2/3 at the vastus ridge on the greater  trochanter.  These were reflected anteriorly.  I then divided the hip  capsule in T-type fashion and the hematoma was encountered.  I then made  a standard neck cut approximately a fingerbreadth above the level of the  lesser trochanter.  Then I was able to remove the head in its entirety  and I trialed a size 45 unipolar head.  This was felt to be the  appropriate  head size.  I then flexed and internally rotated the leg and  brought it off the table in a leg bag.  I used initiating reamer to  enter the femoral canal followed by a lateralizing reamer and then size  1 broach up to size 3 broach.  The size 3 broach was felt to be stable  and I placed a minus 2 spacer and 45 unipolar head, reduced this through  the acetabulum without complications.  Of note, she did lose a  significant amount of blood during that this portion of the case.  I  then removed all trial components, thoroughly irrigated the hip joint  and femoral canal.  I then placed the real size 3 Summit basic Press-Fit  stem without difficulties followed by the minus 3 spacer and size 45  head and then reduced this back into the acetabulum.  I put this through  range of motion.  It was felt to be stable and her leg lengths were  found to be near equal.  I then closed the joint capsule with  interrupted #1 Ethibond suture.  I reapproximated the gluteus medius and  minimus tendons to the vastus ridge and oversewed  this with #1 Ethibond  suture.  A medium Hemovac drain was placed deep and I closed the  iliotibial band with interrupted __________ with #1 Vicryl suture.  The  subcutaneous tissue was closed with interrupted 2-0 Vicryl followed by  interrupted staples on the skin.  Well-padded sterile dressing was  applied.  The patient was rolled to supine position.  Her leg lengths  were found to be near equal.  The knee immobilizer was placed.  She was  awakened, extubated and taken to the recovery room in stable condition.  All final counts were correct and there were no complications noted.      Vanita Panda. Magnus Ivan, M.D.  Electronically Signed     CYB/MEDQ  D:  05/25/2008  T:  05/26/2008  Job:  [id number]

## 2011-01-25 NOTE — Procedures (Signed)
DUPLEX ULTRASOUND OF ABDOMINAL AORTA   INDICATION:  Followup, abdominal aortic aneurysm.   HISTORY:  Diabetes:  No.  Cardiac:  Arrhythmias.  Hypertension:  Yes.  Smoking:  No.  Connective Tissue Disorder:  No.  Family History:  No.  Previous Surgery:  No.   DUPLEX EXAM:         AP (cm)                   TRANSVERSE (cm)  Proximal             2.43 Cm                   2.08 cm  Mid                  4.29 cm                   4.40 cm  Distal               3.48 cm                   3.44 cm  Right Iliac          Not well visualized       Not well visualized  Left Iliac           Not well visualized       Not well visualized   PREVIOUS:  Date:  AP:  5.0 cm per CT  TRANSVERSE:   IMPRESSION:  Abdominal aortic aneurysm noted with the largest  measurement of 4.29 cm X 4.40 cm.   ___________________________________________  [name], M.D.   MG/MEDQ  D:  04/15/2008  T:  04/15/2008  Job:  [id number]

## 2011-01-25 NOTE — Discharge Summary (Signed)
NAMEZOE, GOONAN               ACCOUNT NO.:  [id number]   MEDICAL RECORD NO.:  [id number]          PATIENT TYPE:  INP   LOCATION:  1431                         FACILITY:  [facility]   PHYSICIAN:  Vanita Panda. Magnus Ivan, M.D.DATE OF BIRTH:  May 14, 1921   DATE OF ADMISSION:  05/25/2008  DATE OF DISCHARGE:  05/31/2008                               DISCHARGE SUMMARY   ADMITTING DIAGNOSIS:  Left hip femoral neck fracture.   SECONDARY DIAGNOSES:  1. History of congestive heart failure.  2. History of TSI in past.  3. History of cerebral aneurysm in the past.  4. History of abdominal aneurysm.   PROCEDURE:  Open reduction internal fixation with left hip  hemiarthroplasty on May 25, 2008.   DISCHARGE DIAGNOSIS:  Status post left hip hemiarthroplasty.   SECONDARY DISCHARGE DIAGNOSIS:  1. History of congestive heart failure.  2. TSI in the past.  3. History of cerebral aneurysm.  4. History of abdominal aneurysm.  5. History of atrial fibrillation.   HOSPITAL COURSE:  Briefly, Ms. Israelson is an 77 year old community  ambulating female who lives alone.  She was seen in the emergency room  with her family at the bedside.  However, she actually slipped in the  morning on the day of admission.  She fell on her left hip and had the  inability to ambulate.  The family found her down and she was brought to  the [facility] Emergency Room.  She was found to have a femoral neck  fracture.  She denied any syncopal episode and was deemed stable for  surgery by the medicine service.  This was after discussion with Dr.  Jacky Kindle from [facility].  She was taken to the  operating room on the day of admission where she underwent a left hip  hemiarthroplasty without complication.  For detailed description of the  operation, please refer to the dictated operative note in the patient's  medical record.  Postoperatively, she convalesced on a regular  orthopedic floor.  On  postoperative day #2, she developed atrial  fibrillation with rapid ventricular response and she was taken to the  intensive care unit for monitoring.  A cardiology consultation was  called and the patient did respond well after being seen by Dr.  Patty Sermons.  She was started on Toprol XL and stabilized quite quickly.  She was then transferred to telemetry bed within a day doing much  better.  She did require a transfusion for acute blood loss anemia and  her creatinine did increase postoperatively where this was volume-  related and did respond to blood.  By day of discharge, she was  tolerating a regular diet as well as oral pain medications.  She had her  rate controlled.  We did try to discontinue her Foley catheter but she  had urinary retention so this was inserted.  Cardiology had seen her and  signed off after changing her blood pressure medications.  It was felt  at that point that she could be discharged safely to a skilled nursing  facility for further convalescence and rehabilitation.   DISPOSITION:  [facility].   DISCHARGE INSTRUCTIONS:  While she is at the skilled nursing facility, a  dry dressing can be applied daily to her left hip incision.  She can get  this wet in the shower.  She should work with physical therapy and  occupational therapy with weightbearing as tolerated on the right hip,  gait training, mobility, balance, coordination and activities of daily  living.  The Foley catheter could be managed by the facility's medical  director.  A followup appointment should be established in Dr.  Eliberto Ivory office at [facility] in 2 weeks after discharge as  well as a followup with her primary care physician Dr. Dossie Arbour with  [facility].  A followup with cardiology and Dr.  Patty Sermons will be per her primary care physician if that followup is  needed with cardiology.   DISCHARGE MEDICATIONS:  1. Lopressor/metoprolol 50  mg p.o. every morning and 25 mg p.o. every      evening.  2. Lasix 20 mg p.o. daily.  3. Lexapro 10 mg p.o. daily.  4. Alprazolam 0.5 mg p.o. nightly p.r.n.  5. Senokot 1 tablet p.o. b.i.d. before meals p.r.n.  6. Norco 5/325 1-2 tablets p.o. every 4-6 hours p.r.n. pain.      Vanita Panda. Magnus Ivan, M.D.  Electronically Signed     CYB/MEDQ  D:  05/30/2008  T:  06/02/2008  Job:  [id number]

## 2011-01-25 NOTE — Consult Note (Signed)
NAMECHRYSTAL, [name]               ACCOUNT NO.:  [id number]   MEDICAL RECORD NO.:  [id number]          PATIENT TYPE:  INP   LOCATION:  1431                         FACILITY:  [facility]   PHYSICIAN:  Barry Dienes. [name], M.D.DATE OF BIRTH:  [birth year]   DATE OF CONSULTATION:  DATE OF DISCHARGE:  06/02/2008                                 CONSULTATION   INDICATION FOR CONSULTATION:  Management of atrial fibrillation and  aortic stenosis after a left hip fracture with surgical repair.   HISTORY OF PRESENT ILLNESS:  The patient is an 77 year old Caucasian  woman with multiple medical problems who is status post a fall yesterday  with a left hip fracture followed by hemiarthroplasty last evening.  This morning she complains of mild left hip pain, mild constipation,  however, she denies shortness of breath, chest pain, or nausea.   PAST MEDICAL HISTORY:  Atherosclerotic coronary artery disease with  remote Cardiolite test showing inferolateral ischemia, ischemic  cardiomyopathy with a left ventricular ejection fraction of 35-40%,  abdominal aortic aneurysm with a diameter of 4.4 cm this past summer,  critical aortic valve stenosis, chronic atrial fibrillation with  contraindication to Coumadin treatment, 2004 subarachnoid bleed from an  aneurysm that resulted in a prolonged hospital stay with aneurysm  coiling and placement of a ventriculoperitoneal shunt and pacemaker,  atherosclerotic peripheral vascular disease, moderate bilateral  sensorineural hearing loss, and mild short-term memory deficits.  She  also has chronic insomnia and chronic mild anxiety.  She has also had  peptic ulcer disease, rheumatoid arthritis, followed by Dr. Kellie Simmering,  seasonal allergic rhinitis, hyperlipidemia and bilateral carotid bruits  followed by her cardiologist.   MEDICATIONS PRIOR TO ADMISSION:  Lexapro 10 mg daily, Tessalon 100 mg  p.o. b.i.d. p.r.n. cough, vitamin D 50,000 units p.o. once weekly,  nitroglycerin 0.4 mg sublingual p.r.n. chest pain, Toprol XL 25 mg 2  tablets every a.m. and 1 tablet p.o. q.h.s.   ALLERGIES AND MEDICATION INTOLERANCES:  ASPIRIN HAS BEEN ASSOCIATED WITH  GI UPSET, CODEINE HAS BEEN ASSOCIATED WITH NAUSEA, HALCION WAS  INEFFECTIVE FOR INSOMNIA, AND ARICEPT CAUSED EXTREME NAUSEA AND  VOMITING.   PAST SURGICAL HISTORY:  1950s appendectomy, remote total abdominal  hysterectomy, bilateral cataract operations July 2004, CNS aneurysm  coiling with cardiac pacemaker placement, tracheostomy, and placement of  a ventriculoperitoneal shunt.  May 25, 2008, left hip DePuy  hemiarthroplasty.   FAMILY HISTORY:  Her mother died of gastric cancer.  Two sisters died of  cancer in their 44s.  There is a family history of premature coronary  artery disease but no family history of diabetes mellitus.  She has 3  sons and 1 daughter.  Her daughter, [name], is available at  telephone [phone].  Her son, [name], is available at telephone [phone].   SOCIAL HISTORY:  She has been a widow since January 02, 1999 after  difficult years with a husband who had advanced Alzheimer's disease.  She has no history of tobacco use or alcohol abuse.  Many years ago she  was a moderate smoker.   PHYSICIANS INVOLVED IN HER CARE:  Stacey Drain (rheumatologist),  Cassell Clement (cardiology), Shirlean Kelly (neurosurgery).   REVIEW OF SYSTEMS:  Her vision is intact.  She has chronic moderate  bilateral hearing loss despite use of her hearing aids.  She has not had  any recent fever or chills and denies cough or dyspnea on exertion.  She  has a chronic wide-based gait and generally uses a quad cane or a  walker.  She has chronic mild forgetfulness.  She has not had any recent  nausea, vomiting, diarrhea, constipation, dysuria, frequency, or  feelings of depression.   INITIAL PHYSICAL EXAM:  VITAL SIGNS:  Blood pressure 95/56, pulse 107,  respirations 18, temperature 98.6,  pulse oxygen saturation 98% on 2  liters per minute of nasal cannula oxygen.  In general she is an elderly white female who is in no apparent  distress.  She had moderate decreased hearing and no shortness of breath  while lying supine.  HEAD, EYES, EARS, NOSE AND THROAT:  Exam was within normal limits.  Neck  was supple without jugular venous distention or carotid bruit.  CHEST:  Clear to auscultation.  HEART:  Had an irregular rhythm.  S1 and S2 were present with a systolic  ejection murmur grade 3/6 at the left sternal border.  ABDOMEN:  Had normal bowel sounds and no hepatosplenomegaly or  tenderness.  Extremities were without cyanosis, clubbing, or edema and the pedal  pulses were intact.  NEUROLOGICAL EXAM:  She was alert and answered questions well.  She had  moderate decreased hearing bilaterally.  She was able to move the toes  of her left foot and her other limbs quite well.  Cerebellar testing and  gait were not assessed.   LABORATORY STUDIES:  White blood cell count was 10, hemoglobin 9.7,  hematocrit 28.7, platelets 203.  Serum sodium 141, potassium 3.4,  chloride 105, carbon dioxide 27, BUN 25, creatinine 1.48, glucose 115.  PT 16.  A 12-lead EKG showed the following:  Atrial fibrillation at a  ventricular rate of 86 beats per minute, voltage criteria for left  ventricular hypertrophy, nonspecific ST-segment and T-wave abnormalities  and a prolonged QTC of 478 milliseconds.   IMPRESSION AND PLAN:  1. Atrial fibrillation with aortic stenosis:  She is clinically stable      off of her medicines despite moderately low blood pressure, and she      has critical aortic valve stenosis.  She may need higher blood      pressure to perfuse her left ventricle, so I will add IV fluids at      100 mL per hour.  She has chronic atrial fibrillation currently      with a controlled ventricular rate.  She will restart her beta-      blocker treatment when her blood pressure is  increased somewhat.  2. Constipation:  Mild and likely due to her pain medications, so      GlycoLax will be added along with p.r.n. colon stimulants.  3. Forgetfulness:  Stable and mildly severe.  This is most likely due      to her subarachnoid hemorrhage in 2004 versus Alzheimer disease.      In the past she had intolerance of Aricept treatment.  If her      forgetfulness becomes problematic in terms of rehabilitation, we      will consider adding Namenda to her regimen.  4. History of central nervous system bleed:  Stable after subarachnoid  bleed with aneurysm coiling.  Due to her history of central nervous      system bleed and peptic ulcer disease, she is not a great candidate      for long-term anticoagulation.  However, given the high risks of      deep vein thrombosis and pulmonary embolism after hip fracture,      this warrants short-term treatment with Coumadin for deep vein      thrombosis prophylaxis.           ______________________________  Barry Dienes [name], M.D.    DGP/MEDQ  D:  06/02/2008  T:  06/03/2008  Job:  [id number]

## 2011-01-25 NOTE — Consult Note (Signed)
[name], [name]               ACCOUNT NO.:  [id number]   MEDICAL RECORD NO.:  [id number]           PATIENT TYPE:   LOCATION:                                 FACILITY:   PHYSICIAN:  Cassell Clement, M.D. DATE OF BIRTH:  [birth year]   DATE OF CONSULTATION:  09/10/2007  DATE OF DISCHARGE:                                 CONSULTATION   HISTORY:  This is an 77 year old Caucasian female who was admitted on  September 08, 2007, after suffering a fall at home.  She suffered head  trauma and was admitted with a subdural hematoma.  This patient is known  to me.  She has been followed in our office, but failed to keep her last  appointment and has not been seen since April 12, 2006.  At that time,  she was on Norvasc 5 mg daily, Lanoxin 0.25 mg daily, and Toprol XL 25  mg 1/2 tablet daily.  Apparently, on admission this time, her only  medication was metoprolol.  When we last saw her in the office April 12, 2006, she had a normal left ventricular ejection fraction of 55% by  echo, and she had a tight aortic stenosis of 0.7 cm2 aortic valve area.  However, she is not a candidate for aortic valve replacement because of  her multiple comorbidities, including the fact that she is not a  candidate for anticoagulation because of the past history of previous a  subarachnoid hemorrhage, and because of a tendency to fall.  The patient  denies any recent chest pain or shortness of breath.  According to the  previously dictated history and physical the patient's family has noted  that the patient has had some signs of increasing dementia recently.  However, the patient continues to live by herself.   THE PATIENT IS ALLERGIC TO ASPIRIN AND SULFA.   PAST MEDICAL HISTORY:  Positive for a subarachnoid hemorrhage which  occurred on April 05, 2003.  She developed hydrocephalus as a result of  that, and had a shunt placed.  The she required a temporary  tracheostomy.  She has had a long history of  hypertension.  She has had  a history of atherosclerotic peripheral vascular disease with bilateral  an iliac occlusions, and she had a past history of peripheral  neuropathy.  She also has a past history of urinary tract infections.  She has had a history of abdominal aortic aneurysm.   She does have a mild atherosclerotic coronary disease and she had a  cardiac catheterization in 2001 showing mild coronary irregularities.  She had a past history of a hysterectomy.   SOCIAL HISTORY:  Reveals that she does not use alcohol or tobacco.  She  has been a widow since 2000.  She lives alone.  She has a supportive  family.   FAMILY HISTORY:  Noncontributory.   REVIEW OF SYSTEMS:  Otherwise noncontributory.   PHYSICAL EXAMINATION:  This morning, the patient's blood pressure is up  to 170/74.  When she presented to [facility] ER originally,  her systolic was greater than 200.  Her  pulse this morning is 127 in  atrial fib.  This is an alert Caucasian female.  She has a bandage over the right  temporal area.  CHEST:  Reveals bilateral rales at the bases.  HEART:  Reveals a grade 3/6 harsh murmur of aortic stenosis.  The murmur  radiates to the neck.  ABDOMEN:  Soft and nontender.  There is no abdominal mass.  EXTREMITIES:  Show no phlebitis or edema.   Her chest x-ray shows cardiomegaly with increased interstitial markings   The EKG shows rapid atrial fib and LVH with strain.   Laboratory studies of note included blood sugar 103, sodium 137,  potassium 3.9, BUN of 18, creatinine 1.2.  She does have what appears to  be a urinary tract infection with positive nitrite, many bacteria and  white cells.   IMPRESSION:  1. Chronic atrial fibrillation.  2. Severe aortic stenosis which is not an operative candidate.  3. Recent head trauma with subdural hematoma.  4. Past history of subarachnoid hemorrhage, 2004.  5. Probable urinary tract infection.   PLAN:  We will continue oral  Lopressor and use additional IV Lopressor  for rate control.  We will get a 2-D echo.  We will check a urine  culture.  We will also check some additional cardiac labs including B-  natriuretic peptide, cardiac enzymes.  We will follow with you.           ______________________________  Cassell Clement, M.D.     TB/MEDQ  D:  09/10/2007  T:  09/10/2007  Job:  [id number]   cc:   Coletta Memos, M.D.

## 2011-01-25 NOTE — Assessment & Plan Note (Signed)
OFFICE VISIT   [name], [name]  DOB:  [birth year]                                       04/15/2008  EAVWU#:[id number]   The patient was referred for evaluation and bilaterally aortic aneurysm  was visualized on lumbar spine x-ray done at [facility] on  July 30.  This revealed aneurysm to be approximately 5 cm in diameter  which was uncorrected for magnification.  The patient actually has been  followed by Dr. Edilia Bo in our office for abdominal aortic aneurysm  since 2002, most recent duplex scan being in 2007 when the aneurysm  measured 3.6 x 3.5 cm.  She has never had a CT scan of the aneurysm.  She is having mechanical symptoms in her low back and hips.  She denies  any chest pain but does have occasional dyspnea on exertion and has a  chronic arrhythmias as well as a heart murmur.  She does not have severe  lower extremity symptoms.  She is allergic to aspirin.   PHYSICAL EXAM:  Blood pressure 120/80, heart rate 80 and irregular,  respirations 14.  General:  She is an elderly female in no apparent  distress.  Alert and oriented x3.  Neck supple with 3+ carotid pulses  palpable.  No bruits are audible.  She has prominent systolic murmur in  the precordium.  Heart rate is irregularly irregular.  Abdomen is soft,  nontender with a small pulsatile mass measuring approximately 4 cm.  She  has excellent femoral pulses bilaterally with well-perfused lower  extremities.   Duplex scan of the aneurysm today measures 4.4 x 4.3 cm which is  slightly enlarged over the last 2 years.   I discussed the fact that this aneurysm will likely never require  treatment, but we will continue to need to monitor it for size.  She  will return in 1 year to see Dr. Edilia Bo who has followed her over the  years and have a duplex scan of time of her return.   [name], M.D.  Electronically Signed   JDL/MEDQ  D:  04/15/2008  T:  04/16/2008  Job:  [id number]

## 2011-01-25 NOTE — H&P (Signed)
[name], [name]               ACCOUNT NO.:  [id number]   MEDICAL RECORD NO.:  [id number]          PATIENT TYPE:  EMS   LOCATION:  [facility]                         FACILITY:  [facility]   PHYSICIAN:  Mark A. Perini, M.D.   DATE OF BIRTH:  27-Apr-1921   DATE OF ADMISSION:  04/20/2008  DATE OF DISCHARGE:                              HISTORY & PHYSICAL   CHIEF COMPLAINT:  Shortness of breath.   HISTORY OF PRESENT ILLNESS:  [name] is a pleasant 77 year old female  with past medical history as listed below.  She felt bad this week and  today developed chills, shortness of breath and weakness.  Her family  called 9-1-1.  In the emergency room, she was found to have a febrile  urinary tract infection.  She has chronic atrial fibrillation and she  was tachycardiac, but this improved with antipyretic treatment.  She  will require admission.   PAST HISTORY:  1. Atherosclerotic coronary disease.  2. A 4.4 cm aortic aneurysm last checked in the last few days.  3. Hypertension.  4. Subdural hematoma in January 2009.  5. Ejection fraction 55% January 2009.  6. History of an aneurysmal subarachnoid hemorrhage in 2004 with a      prolonged hospital course.  She had a tracheostomy for a time she      did develop communicating hydrocephalus after this, and no shunt      placed after this  7. Atherosclerotic peripheral vascular disease.  8. Chronic atrial fibrillation.  9. Decreased hearing.  10.Short-term memory loss reported by the family for the last 4 years.      She does get confused at times.   ALLERGIES:  ASPIRIN, CODEINE, SULFA   MEDICATIONS:  Amlodipine 10 mg daily.  This is the only medicine she  takes, although the ER also noted that she may be on metoprolol ER 25 mg  daily.  She was given Tylenol and Rocephin in the emergency room.   SOCIAL HISTORY:  No tobacco, no alcohol or drugs.  She lives alone.  She  has one daughter and two sons.  They check on her frequently.  She has  been a  widow since 2001.  She has seven grandchildren and three great-  grandchildren.   FAMILY HISTORY:  Noncontributory.   REVIEW OF SYSTEMS:  No chest pain.  No dysuria.  No blood from above or  below.  She normally walks with a cane.   PHYSICAL EXAMINATION:  VITAL SIGNS:  Temperature 101.4, blood pressure  115/58, pulse 123 which is now 88.  Saturation was 90%, now 97%  saturation on 2 liters of oxygen.  GENERAL:  She is lying supine in no acute distress.  HEENT:  There is no JVD.  No icterus, no pallor.  LUNGS:  She has a few inspiratory crackles on exam.  HEART:  Irregular irregular with a 3/6 murmur heard at the left and  right sternal border in systole.  ABDOMEN:  Soft, nontender, nondistended with no mass or  hepatosplenomegaly.  There is no edema.  She has decreased hearing   LABORATORY DATA:  Sodium 137, potassium 3.4, chloride 100, CO2 27, BUN  21, creatinine 1.19, glucose 125, calcium 9.0, protein 6.1, albumin 3.5,  AST 15, ALT and 10, INR 1.0.  BNP slightly elevated at 211, myoglobin  95, CK-MB less than 1.0, troponin I less than 0.05.  EKG shows atrial  fibrillation with old septal Q-waves and some inferolateral ST  depression.  Urinalysis is cloudy with 11-20 white cells, 0-2 red cells  and a few bacteria.  White count 15.5, 91% segs, 4% lymphocytes, 4%  monocytes.  Hemoglobin 12.4, platelet count 294,000 CT angiogram of the  chest shows no pulmonary embolism.  She does have a 1.5 cm nodule on the  left upper lung which could be a malignancy.  She has some mediastinal  lymphadenopathy, and she also has some of left adrenal gland nodularity.  This could be consistent with scarring or she could have a malignancy.  Chest x-ray shows able cardiac enlargement and chronic interstitial  markings.   ASSESSMENT/PLAN:  An 77 year old female with febrile urinary tract  infection.  She has chronic atrial fibrillation, but she is not an  anticoagulation candidate given multiple head  bleeds in the past and  that she also has gait instability.  Will admit.  Will continue Rocephin  intravenously and Tylenol as needed.  Will decrease her amlodipine to 5  mg daily and continue low dose metoprolol 25 mg extended release daily.  She may need a mild diuretic added if her blood pressure is elevated or  she has continued shortness of breath.  She desires a DNR code status  which I believe is appropriate given her age and medical condition.      Mark A. Perini, M.D.  Electronically Signed     MAP/MEDQ  D:  04/20/2008  T:  04/20/2008  Job:  [id number]   cc:   Barry Dienes. [name], M.D.  Cassell Clement, M.D.

## 2011-01-25 NOTE — Consult Note (Signed)
[name], [name]               ACCOUNT NO.:  [id number]   MEDICAL RECORD NO.:  [id number]          PATIENT TYPE:  INP   LOCATION:  5504                         FACILITY:  [facility]   PHYSICIAN:  Marca Ancona, MD      DATE OF BIRTH:  Aug 09, 1921   DATE OF CONSULTATION:  07/12/2008  DATE OF DISCHARGE:                                 CONSULTATION   PRIMARY CARDIOLOGIST:  Cassell Clement, MD   CHIEF COMPLAINT:  Confusion and bradycardia.   HISTORY OF PRESENT ILLNESS:  This is an 77 year old female with multiple  medical problems who was brought to the emergency room today by her  family because of confusion.  At baseline, the patient was brought to  the emergency room today by EMS because of confusion and was noted to  have bradycardia as well.  At baseline, the patient lives by herself  despite her significant comorbidities.  She does have a daughter who  lives next door to help out.  Today, she was found by her family to be  confused and disoriented.  She had been in her normal state of health  the prior day.  She had no episodes of syncope.  No falls.  She denies  chest pain, shortness of breath, or lightheadedness.  She is able to  converse, but is disoriented.  When EMS was called, they found that her  heart rate was in the 30s and atrial fibrillation.  She was given  atropine 0.5 mg.  Her heart rate has increased now from 50s to 60s and  her blood pressure is 95/50.   PAST MEDICAL HISTORY:  1. Left hip fracture with hemiarthroplasty in September 2009.  2. AAA is 4.4 cm.  3. Chronic atrial fibrillation.  She is not on Coumadin secondary to      subarachnoid hemorrhage.  4. Peptic ulcer disease.  5. Rheumatoid arthritis.  6. Severe aortic stenosis.  Echocardiogram done on December 2008      showed EF of 60%-65%, moderate LVH, aortic valve area 0.7 sq cm      with a mean gradient of 32, pulmonary artery systolic pressure of      35.  7. Subarachnoid hemorrhage in 2004.  She  had aneurysm coiling and VP      shunt placed.  8. Hypercholesterolemia.  9. Peripheral arterial disease.  She did have a right iliac occlusion      noted on her heart catheterization in 2001.  10.Coronary artery disease.  The patient had a heart catheterization      in 2001 with 50% OM-2 stenosis.   MEDICATIONS AT HOME:  1. Toprol-XL 50 mg q.a.m. and 25 mg q.p.m.  2. Norvasc 5 mg daily.  3. Lexapro.  4. Lasix 20 mg daily.  5. Xanax nightly.   SOCIAL HISTORY:  The patient lives by herself.  She is widowed.  Her  daughter does live next door.  She does not smoke, does not drink  alcohol.   FAMILY HISTORY:  Noncontributory.   REVIEW OF SYSTEMS:  Negative except as per the history of present  illness.  RADIOLOGY:  Head CT showed no acute abnormality.  Chest x-ray showed  mild left lower lobe atelectasis.  EKG shows slow atrial fibrillation at  rate of 45, LVH, nonspecific ST-segment changes.   LABS:  Hematocrit 34.3, white count 7.3, platelets 279.  Sodium 137,  potassium 3.4,  creatinine 1.16.  LFTs normal.  BNP 479.  CK-MB less  than 1.  Troponin less than 0.05.  Urinalysis is suggestive of UTI.   PHYSICAL EXAMINATION:  VITAL SIGNS:  Temperature 98.7, heart rate 50s-  60s and irregular, blood pressure is 95/50, O2 saturations 90%-94% on  room air.  GENERAL:  The patient is a frail elderly female.  NEUROLOGIC:  The patient is confused.  She is oriented only to the  month.  She otherwise does not know where she is or what the day is.  She is able to follow commands and answer questions regarding symptoms.  HEENT:  Normal.  ABDOMEN:  Soft, nontender, and no hepatosplenomegaly.  Normal bowel  sounds.  NECK:  No thyromegaly.  No thyroid nodule.  JVP is 8-9 cm of water.  CARDIOVASCULAR:  Irregular S1 and S2.  There is a 3/6 crescendo-  decrescendo systolic late-peaking murmur that obscures S2.  There is no  peripheral edema.  EXTREMITIES:  No clubbing or cyanosis.  LUNGS:   There are bibasilar crackles.  SKIN:  Normal exam.  MUSCULOSKELETAL:  Normal exam.   ASSESSMENT/PLAN:  This is an 77 year old with severe aortic stenosis,  chronic atrial fibrillation, coronary artery disease, and history of  subarachnoid hemorrhage who presents with confusion.  The patient is  found to have UTI.  She was also bradycardic initially with a heart rate  30s and atrial fibrillation, which raised to the 50s to 60s after 0.5 mg  of atropine by EMS.  No syncope.  1. Rhythm.  The patient does have sick sinus syndrome of the      tachybrady variant.  We will plan on holding her Toprol-XL.  We      will need to follow up off the beta-blocker to see if pacemaker      would be needed.  However, would like to avoid this if possible      given her multiple comorbidities.  We would keep the pacing pads on      for now.  She does have baseline atrial fibrillation, but is not a      Coumadin candidate secondary to history of subarachnoid hemorrhage      and falls.  2. Critical aortic stenosis.  This is being managed medically probably      secondary to her comorbidities.  3. Urinary tract infection, treating this per the primary service.  4. Confusion, this is likely due to combination of the urinary tract      infection and the decreased perfusion from the bradycardia.      Marca Ancona, MD  Electronically Signed     DM/MEDQ  D:  07/13/2008  T:  07/13/2008  Job:  [id number]

## 2011-01-25 NOTE — H&P (Signed)
[name], [name]               ACCOUNT NO.:  [id number]   MEDICAL RECORD NO.:  [id number]          PATIENT TYPE:  INP   LOCATION:  5504                         FACILITY:  [facility]   PHYSICIAN:  Kari Baars, M.D.  DATE OF BIRTH:  01-Aug-1921   DATE OF ADMISSION:  07/12/2008  DATE OF DISCHARGE:                              HISTORY & PHYSICAL   CHIEF COMPLAINT:  Confusion.   HISTORY OF PRESENT ILLNESS:  Ms. Mascari is an 77 year old white female  with a history of coronary artery disease/congestive heart  failure/critical aortic stenosis, history of subarachnoid hemorrhage  status post VP shunt.  She presented to the emergency department today  with altered mental status for the past 2-3 days.  At baseline, the  patient has mild short-term memory loss, but is able to live alone with  a fairly good quality of life.  Her normal primary caregiver, her  daughter who lives next door has been out of town for the past week, but  set aside her medications, which she has been taking appropriately.  The  other daughters who see her frequently states that for the past several  days they have noticed significant increase in confusion.  Today, she  was very disoriented and was asking about deceased relatives.  She  actually called her family to complain that her children would not go to  school, although there were no children in the home.  In addition, she  has had recent unsteady gait.  They have not noticed any other  complaints and denied any recent fevers, chills, sweats, cough, dysuria,  chest pain, or shortness of breath.  I was called by the daughter with  these symptoms and suggested EMS for evaluation in the emergency  department.  Upon arrival, EMS found her heart rate to be in the 30s.  They administered an atropine with an increase into the 40s or 50s.  She  did have some improvement in her mental status following this.  However,  she is now much more disoriented again.  In the  emergency department,  she has undergone a workup, which includes a head CT that shows no acute  intracranial abnormalities.  It does not show any significant  malfunction of the VP shunt and no subdural hematoma.  Her laboratory  evaluation is unrevealing except for positive UTI.  She will be admitted  for further management of delirium, bradycardia, and urinary tract  infection.   REVIEW OF SYSTEMS:  All systems reviewed with the patient and are  negative except in the HPI.  History is limited by the patient's  disorientation.   PAST MEDICAL HISTORY:  1. Coronary artery disease.  2. Ischemic cardiomyopathy with prior ejection fraction of 35-40% in      2004, and up to 60-65% in December 2008.  3. Aortic stenosis, severe with a mean gradient of 32 mmHg and aortic      valve area 0.68 cm2 by BTI and 0.65 cm2 by VMAX in December 2008.  4. Atrial fibrillation - not on anticoagulation due to prior      subarachnoid hemorrhage.  5. Subdural hematoma (January 2009).  6. Subarachnoid hemorrhage (2004 with aneurysm coiling and VP shunt      due to resultant hydrocephalus.  7. Abdominal aortic aneurysm 4.44 cm.  8. Peripheral vascular disease.  9. Mild dementia.  10.Anxiety.  11.Rheumatoid arthritis.  12.Hyperlipidemia.  13.Recent left hip fracture, status post left hemiarthroplasty      (September 2009).  14.Status post appendectomy.  15.Total abdominal hysterectomy.   MEDICATIONS:  Her medications - I have a list provided to the EMS from  her daughter, although I am not sure that this is correct.  1. Norvasc 5 mg reportedly four times a day.  2. Toprol-XL 25 mg two in the morning and one in the evening.  3. Lexapro 10 mg reportedly four times a day.  4. Lasix 20 mg reportedly four times a day.  5. Xanax 0.5 mg q.h.s.  6. Darvocet-N 100.   ALLERGIES:  1. ASPIRIN causes GI upset.  2. CODEINE causes nausea.  3. ARICEPT causes nausea and vomiting.  4. SULFA.   SOCIAL HISTORY:   She is a widow.  She lives alone with her daughter  living next door.  At baseline, she is fairly functional.  No tobacco,  alcohol, or drug use.   FAMILY HISTORY:  Unable to obtain due to her confusion, but  noncontributory.   PHYSICAL EXAMINATION:  VITAL SIGNS:  Temperature 98.7, blood pressure  initially 119/60 and subsequently 146/63, and pulse rate 43-54,  respirations 20, and oxygen saturation 94% on room air.  GENERAL:  She is agitated and disoriented.  She is trying to get out of  the bed, but able to be redirected by family members.  HEENT:  Pupils equal, round, and reactive to light.  Extraocular  movement is intact.  Oropharynx moist.  NECK:  Supple without lymphadenopathy.  She does have bilateral carotid  bruits.  HEART:  Bradycardic and regular with a grade 3/6 systolic ejection  murmur.  LUNGS:  Bibasilar crackles.  ABDOMEN:  Soft, nondistended, and nontender with normoactive bowel  sounds.  EXTREMITIES:  No clubbing, cyanosis, or edema.  NEUROLOGIC:  She moves all extremities with no focal deficits.   LABORATORIES:  CBC shows a white count of 7.3, hemoglobin 11.3, and  platelets 279.  BMET is significant for sodium 137, potassium 3.4,  chloride 101, bicarb 29, BUN 18, creatinine 1.2, and glucose 76.  Urinalysis shows 7-10 white blood cells, positive nitrites and  leukocytes.  Troponin less than 0.05.  Liver function tests normal.   LABORATORY STUDIES:  1. Head CT personally reviewed shows no acute intracranial      abnormalities.  No hydrocephalus.  Right ventricular catheter in      place with no complications.  2. Chest x-ray, mild left lower lobe atelectasis.  COPD with      cardiomegaly.  3. EKG, atrial fibrillation with slow ventricular response.  Left axis      deviation.  Lateral T-wave inversion.   ASSESSMENT AND PLAN:  1. Delirium - this is likely metabolic secondary to urinary tract      infection versus bradycardia resulting in poor cerebral blood  flow      in the setting of her severe aortic stenosis.  We will admit her to      a telemetry bed and treat her urinary tract infection.  We will      hold all AV nodal blocking agents including Toprol, and to a lesser      extent Norvasc.  Obtain a TSH.  We will use Ativan as needed for      severe agitation.  Avoid Haldol due to the risk of arrhythmia.  2. Urinary tract infection - Cipro 400 mg IV b.i.d.  Check blood      cultures and urine cultures.  3. Bradycardia - I suspect that she has an element of sick sinus      syndrome in the setting of her atrial fibrillation, advanced age,      and severe aortic stenosis.  We will hold her Toprol.  Cardiology      consult will be obtained given the severity of her bradycardia and      prior need for atropine.  Ultimately, she may need to consider      pacemaker, though I am not sure that this is in her best interest      given her medical comorbidities.  We will replete her potassium as      well.  Rule out myocardial infarction with serial enzymes and      monitor BNP with gentle hydration.  4. Ethics - I discussed this with her daughter who reports that she      clearly has stated a DNR wishes in the past.  She does not desire      any extraordinary measures.  She thinks that she may be willing to      consider pacemaker, but this would need to be discussed with Dr.      [name] and Dr. Patty Sermons.      Kari Baars, M.D.  Electronically Signed     WS/MEDQ  D:  07/12/2008  T:  07/13/2008  Job:  [id number]   cc:   Barry Dienes. [name], M.D.  Cassell Clement, M.D.

## 2011-01-25 NOTE — Consult Note (Signed)
[name], GURAL               ACCOUNT NO.:  [id number]   MEDICAL RECORD NO.:  [id number]          PATIENT TYPE:  INP   LOCATION:  1512                         FACILITY:  [facility]   PHYSICIAN:  Dyke Maes, M.D.DATE OF BIRTH:  Sep 20, 1920   DATE OF CONSULTATION:  10/02/2008  DATE OF DISCHARGE:                                 CONSULTATION   REFERRING PHYSICIAN:  Barry Dienes. [name], M.D.   REASON FOR CONSULTATION:  Acute renal insufficiency.   HISTORY OF PRESENT ILLNESS:  This is an 77 year old white female  admitted on September 29, 2008, for confusion and acute psychosis.  Apparently she has had poor p.o. intake for several days prior to the  admission and apparently a dipstick of urine was done which showed  leukocytes and thus she was placed on Cipro.  Her confusion and  psychosis worsened and she was brought to the hospital.  In the  emergency room she was found to have a serum creatinine of 3.3.  on  July 15, 2008, her serum creatinine was 0.8.  During this  hospitalization her serum creatinine has progressively risen to 4.0.  The patient is confused and unable to give me any meaningful history.  Her urine output today is 850 mL so far.  A renal ultrasound showed a  right kidney of 10.8 cm and the left kidney is 10.1 cm with no  hydronephrosis.  She was not on an ACE inhibitor or ARB prior to  admission according to the admission H and P.   PAST MEDICAL HISTORY:  1. Includes coronary artery disease.  2. Severe aortic stenosis.  3. Chronic atrial fibrillation status post pacemaker placement.  4. History of subarachnoid hemorrhage status post a VP shunt.  5. History of a 4.4 cm abdominal aortic aneurysm.  6. History of subdural hematoma.  7. History of dementia.  8. Rheumatoid arthritis.  9. Peripheral vascular disease.  10.Status post left hip replacement.  11.Status post total abdominal hysterectomy.  12.Status post appendectomy.   ALLERGIES:  ASPIRIN causes  GI upset, CODEINE and ARICEPT cause nausea,  SULFA unknown reaction.   MEDICATIONS:  1. Norvasc 5 mg a day.  2. Clonidine 0.1 mg b.i.d.  3. Lexapro 10 mg daily.  4. Haldol 2 mg daily.  5. Metoprolol 50 mg b.i.d.  6. Protonix 40 mg daily.   SOCIAL HISTORY:  Nonsmoker, nondrinker.  She is widowed.  She lives  alone but her daughter lives next door.   FAMILY HISTORY:  Per the admitting H and P suggests only coronary artery  disease and gastric cancer in mother.   REVIEW OF SYSTEMS:  Again, is fairly unreliable as she is not a very  good historian but she has not complained of any shortness of breath.  No chest pains.  No abdominal pains.  No dysuria.  Unable to get any  positives in the complete review of systems.   PHYSICAL EXAM:  VITAL SIGNS:  Blood pressure 162/92, pulse 72,  temperature 97.3.  GENERAL:  She is awake and alert.  No acute distress.  HEENT:  Sclerae nonicteric.  Extraocular  muscles are intact.  NECK:  Reveals no JVD.  LUNGS:  Crackles in the left base.  HEART:  Irregular irregular with a 3/6 systolic murmur at the left  sternal border.  ABDOMEN:  Positive bowel sounds, nontender, nondistended.  No  hepatosplenomegaly.  There is abdominal bruit.  EXTREMITIES:  No edema.  There is mild cyanosis on the plantar aspect of  her feet but no evidence for cholesterol emboli.  NEUROLOGIC:  Cranial nerves intact.  She answers questions but typically  states I can't remember.  She is not oriented as she can't remember.  Her pulses are 2/4 in her carotids, radials, femorals and 1/4 in  dorsalis pedis and posterior tibial.   LABORATORY:  Shows urinalysis of 30 mg protein, 7-10 WBCs, creatinine of  4.0, BUN 36, potassium 4.3, bicarb 21, white count 12, platelet count  324,000, white count 8.3.  There is only 1% eosinophils on the CBC on  admission.   IMPRESSION:  1. Acute renal failure of uncertain etiology.  Certainly with her poor      p.o. intake prior to admission  acute tubular necrosis is on top of      the list.  2. She does have some mild pyuria which raises the possibility of      interstitial nephritis.  3. She has a history of an abdominal aortic aneurysm.  Whether this is      infrarenal or suprarenal is unknown as I cannot find any      radiographic reports to tell me about an abdominal aneurysm.  She      has no peripheral stigmata of cholesterol emboli.  Another      possibility would be embolic phenomenon from her atrial      fibrillation.   RECOMMENDATION:  1. Will discontinue IV fluids and normal saline lock.  2. Will place Foley catheter so we can monitor urine output.  3. Will check a urine for eosinophils.  4. Check a chest x-ray.  5. Will get a renal scan to look at blood flow to both kidneys.  6. Daily serum creatinine.   Thank you very much for this consult.  Will follow the patient with you.           ______________________________  Dyke Maes, M.D.     MTM/MEDQ  D:  10/02/2008  T:  10/02/2008  Job:  [id number]

## 2011-01-25 NOTE — H&P (Signed)
[name], [name]               ACCOUNT NO.:  [id number]   MEDICAL RECORD NO.:  [id number]          PATIENT TYPE:  INP   LOCATION:  1518                         FACILITY:  [facility]   PHYSICIAN:  Barry Dienes. [name], M.D.DATE OF BIRTH:  1920/09/30   DATE OF ADMISSION:  09/29/2008  DATE OF DISCHARGE:                              HISTORY & PHYSICAL   CHIEF COMPLAINT:  I've lost my mind.   HISTORY OF PRESENT ILLNESS:  The patient is an 77 year old Caucasian  woman who is well known to me.  For the past 4 to 5 days she has had  progressed to the progressively worsening confusion.  Last Friday  (September 26, 2008) a family member did a urinalysis test on her that was  positive for leukocytes, so she was started on ciprofloxacin for  possible urinary tract infection.  Her confusion worsened over the  weekend with hallucinations, so she was brought to the emergency room  today.  She has not had recent fever, chills, vomiting, diarrhea,  constipation, or dysuria.  She has also not had productive cough or  chest pain.  At her last office visit on August 11, 2008 she was  troubled by poor short-term memory with poor quality sleep and the was  noted to have lost 7 pounds since her previous office visit.  She was  advised to try to soups and ice cream to regain the weight loss.  She  was also given a starter kit for Namenda starting at 5 mg once daily and  proceeding to 10 mg twice daily.  However, this was associated with  crazy dreams per family members, and stopped on September 15, 2008.  Today  she was seen with her son present.  She felt as if there were children  outside of the room crying or uncomfortable and she kept trying to get  up to visit them.  In the past, ARICEPT was tried with her, which caused  extensive nausea and vomiting.   PAST MEDICAL HISTORY:  Coronary artery disease, ischemic cardiomyopathy  with ejection fraction of 35-40% in 2004 and 60-65% in December 2008,  aortic  stenosis, severe with a mean gradient of 32 mm and aortic valve  area 0.8 cm2.  Chronic atrial fibrillation, not on anticoagulation due  to a subarachnoid hemorrhage.  Fall with subdural hematoma in January  2009.  2004 subarachnoid hemorrhage with hospitalization and aneurysm  coiling and placement of VP shunt due to hydrocephalus.  Abdominal  aortic aneurysm, last measured 4.4 cm.  Atherosclerotic peripheral  vascular disease, mild dementia, chronic anxiety, rheumatoid arthritis,  hyperlipidemia.  September 2009 left hip fracture, status post left  hemiarthroplasty.  She is also status post appendectomy and status post  abdominal hysterectomy.   MEDICATIONS PRIOR TO ADMISSION:  1. Alprazolam 0.5 mg p.o. nightly p.r.n. for sleep.  2. Lexapro 10 mg daily.  3. Metoprolol ER 50 mg daily.  4. Ultram 50 mg 1 to 2 tablets p.o. every 8 hours p.r.n. pain.   ALLERGIES:  ASPIRIN causes GI upset.  CODEINE causes nausea.  ARICEPT  caused nausea and vomiting.  SULFA DRUGS.   SOCIAL HISTORY:  She is a widow.  She lives alone and her daughter lives  right next door.  At baseline she is fairly functional.  She has no  history of alcohol or tobacco abuse.  She has 3 sons and 1 daughter, and  7 grandchildren.   FAMILY HISTORY:  Most notable for premature coronary artery disease and  gastric cancer in her mother.   PAST SURGICAL HISTORY:  1950s appendectomy, total abdominal  hysterectomy, cataract operations, July 2004 aneurysm coiling, cardiac  pacemaker placement, ventricular peritoneal shunt placement and  tracheostomy (temporary), September 2009 left hip fracture with  hemiarthroplasty.   REVIEW OF SYSTEMS:  See history of present illness.  She has chronic  moderate anxiety and chronic difficulty sleeping.  She generally sleeps  during the day and has a difficult time at night.  She has chronic poor  food intake but reasonable fluid intake.  She has early satiety.  She  has chronic mild  arthralgias and mild left hip pain.   PHYSICAL EXAM:  VITAL SIGNS:  Blood pressure 144/79, pulse 70,  respirations 20, temperature 98.3, pulse oxygen saturation 98% on room  air.  GENERAL:  She is an elderly white female who is agitated and confused in  bed.  HEAD, EARS, EYES, NOSE, AND THROAT:  Exam was within normal limits.  NECK:  Supple without jugular venous distention or carotid bruit.  CHEST:  Clear to auscultation.  HEART:  Had an irregularly irregular rhythm with a systolic ejection  murmur of grade 2/6 at the left sternal border.  ABDOMEN:  Had normal bowel sounds and no hepatosplenomegaly.  EXTREMITIES:  Without cyanosis, clubbing, or edema.  NEUROLOGICAL:  She was alert, agitated, and oriented times 1, she has  moderately severe bilateral hearing loss.  She was able to move all  extremities well.  While I was with her, she had the feeling that there  were children sitting on the couch in the room and other children  outside the room crying (this was not true).  She made several attempts  to get out of bed to assist the children, despite the reassurance that  there was not a problem.   INITIAL LABORATORY STUDIES:  White blood cell count 8.3, hemoglobin 12,  hematocrit 36, platelets 355,000.  Serum sodium 137, potassium 3.8,  chloride 100, carbon dioxide 27, BUN 38, creatinine 3.35, glucose 136,  total protein 6.2, albumin 2.8.  CT scan of the head without contrast  showed no acute abnormalities.  Chest x-ray again showed cardiomegaly  with no acute abnormalities.   November 2009 labs were most notable for BUN of 8, creatinine 0.89 and,  BNP 666.   IMPRESSION AND PLAN:  1. Altered mental status:  It is unclear exactly what has caused her      to be traumatically more confused than usual with hallucinations.      It is quite possible this is due to acute withdrawal of her Lexapro      or lingering effects from treatment with Namenda.  She may have a      urinary tract  infection that is partially treated.  Alternatively,      she could have poor metabolism off of her Ultram due to increased      acute renal insufficiency.  I doubt that she has hepatic      encephalopathy.  Her head CT scan does not suggest an acute CNS      event.  I plan to continue IV fluids.  We will schedule Haldol 2 mg      every p.m. and every 4 hours as needed for continued hallucinations      for now.  We have restarted her Lexapro.  2. Acute renal insufficiency:  Unclear cause, but most likely      secondary to decreased food and fluid intake with hypoperfusion of      the kidneys.  We will make sure she has no nephrotoxins and we will      continue IV fluids and      monitoring her BUN and creatinine levels closely.  3. Atrial fibrillation:  Stable rate control on Toprol with aspirin      held due to problems with gastrointestinal upset and central      nervous system bleed.           ______________________________  Barry Dienes [name], M.D.     DGP/MEDQ  D:  09/29/2008  T:  09/29/2008  Job:  [id number]   cc:   Cassell Clement, M.D.  Fax: [phone]

## 2011-01-25 NOTE — Discharge Summary (Signed)
[name], [name]               ACCOUNT NO.:  [id number]   MEDICAL RECORD NO.:  [id number]          PATIENT TYPE:  INP   LOCATION:  1512                         FACILITY:  [facility]   PHYSICIAN:  Barry Dienes. [name], M.D.DATE OF BIRTH:  [birth year]   DATE OF ADMISSION:  09/29/2008  DATE OF DISCHARGE:                               DISCHARGE SUMMARY   HISTORY OF PRESENT ILLNESS:  The patient is an 77 year old Caucasian  woman who is well-known to me,.  For four to five days prior to  admission she had progressively worsening confusion.  On January 15th, a  family member performed a urinalysis test on her that was positive for  leukocytes, so she was started on ciprofloxacin for possible urinary  tract infection.  Her confusion worsened over the weekend with  hallucinations, so she was brought to the emergency room on the day of  admission.  She had not had recent fever, chills, vomiting, diarrhea,  constipation, or dysuria.  She also had not had recent cough or chest  pain.  At her last office visit on August 11, 2008 she was troubled by  gradually worsening short-term memory with poor quality of sleep with a  7-pound weight loss since her previous office visit.  She was advised to  try soup and ice cream to improve her weight loss and was also given a  starter kit for Namenda starting at 5 mg daily proceeding to 10 mg twice  daily.  However, this was associated with crazy dreams per family  members and was stopped on  September 15, 2008.  At the time of my initial evaluation she was very  anxious and concerned that there were children outside of her room  crying or comfortable, and she kept trying to get up to visit them.  In  the past she was treated with Aricept that caused excessive nausea and  vomiting.   PAST MEDICAL HISTORY:  Other past medical history:  Coronary artery  disease with ischemic cardiomyopathy and left ventricular ejection  fraction of 35-40% in 2004 that improved  to 60-65% in December 2008,  severe aortic stenosis with a 32-mm gradient and aortic valve area 0.8  cm2, chronic atrial fibrillation for which she is not an anticoagulation  candidate due to subarachnoid hemorrhage, fall with subdural hematoma in  January 2009, subarachnoid hemorrhage in 2004 with hospitalization  showing CNS aneurysm that was coiled and a VP shunt was placed due to  hydrocephalus, abdominal aortic aneurysm last measured at 4.4 cm in  December 2009, atherosclerotic peripheral vascular disease, mild  Alzheimer's disease, chronic anxiety, rheumatoid arthritis,  hyperlipidemia, left hip fracture in September 2009, status post  hemiarthroplasty.  She is also status post appendectomy and abdominal  hysterectomy.   MEDICATIONS PRIOR TO ADMISSION:  Alprazolam 0.5 mg p.o. q.h.s. p.r.n.  sleep, Lexapro 10 mg daily, metoprolol ER 50 mg daily, Ultram 50 mg one  or two tablets every 8 hours p.r.n. pain.   ALLERGIES:  ASPIRIN causes GI upset, CODEINE has been associated nausea,  ARICEPT has been associated nausea and vomiting.  SULFA DRUGS.  See admission history and physical for details of her social history,  family history, past surgical history, and review of systems.   INITIAL PHYSICAL EXAM:  VITAL SIGNS:  Blood pressure 144/79, pulse 70,  respirations 20, temperature 98.3, pulse oxygen saturation 98% on room-  air.  GENERAL:  She is an elderly white female who is agitated and confused in  bed.  HEAD, EYES, EARS, NOSE AND THROAT:  Within normal limits.  NECK:  Supple  without jugular venous distention or carotid bruit.  CHEST:  Clear to  auscultation.  HEART:  Irregularly irregular rhythm with a grade 2/6 systolic ejection  murmur at the left sternal border.  ABDOMEN:  Normal bowel sounds and no hepatosplenomegaly.  EXTREMITIES:  Without cyanosis, clubbing, or edema.  NEUROLOGIC:  She is alert, agitated and oriented times one.  She has  moderately severe bilateral  hearing loss.  She was able to move all  extremities well.  When I was with her, she had the feeling there were  children on the couch in her room and outside of the room crying (this  was not true).   INITIAL LABORATORY STUDIES:  White blood cells 8.3, hemoglobin 12,  hematocrit 36, platelets 355, serum sodium 137, potassium 3.8, chloride  100, carbon dioxide 27, BUN 38, creatinine 3.35, glucose 136, total  protein 6.2, albumin 2.8.  CT scan of the head without contrast showed no acute abnormalities.  Chest x-ray showed cardiomegaly without acute abnormalities.   In November 2009, office labs were notable for a BUN of 8 with a  creatinine of 0.89 and BNP 666.   HOSPITAL COURSE:  The patient was admitted to a medical bed without  telemetry.  She was started on Haldol for delusions.  She was initially  tried on gentle IV fluids for possible intravascular volume depletion.  With that, she did not become short of breath, however, there was no  significant interval change in her elevated creatinine level.  She was  also seen by a psychiatrist, who noted that she was a delightful woman  who had delirium, anxiety and moderate dementia.  He recommended  continuing memory and orientation cues.  He felt the Lexapro was not  likely contributing to her delirium, and that Haldol would be useful  short-term to improve her delirium symptoms.  He felt that if her  delirium persisted the week we could consider discontinuing Xanax.  She  was also seen by a Nephrology consultant who noted the lack of  improvement in her serum creatinine with IV fluids and recommended its  discontinuation.  He recommended checking her urine for eosinophils to  rule out interstitial cystitis, and checking a renal scan to assess  blood flow to the kidneys.  He did not feel that cholesterol emboli  played a role as there was no peripheral stigmata.  Special laboratory  tests including a serum protein electrophoresis, CRP,  a 24-hour urine  protein excretion were not significantly out of the normal range to  suggest a definitive diagnosis of  acute renal failure.  She also had a  renal ultrasound which showed the right kidney measuring 10.8 cm in  length and the left kidney 10.1 cm.  There was bilateral renal cortical  thinning with no evidence of hydronephrosis, and a left-sided cortical  cyst that measures 2.8 x 2.9 cm.  The visualized urinary bladder was  unremarkable.  A nuclear medicine renal scintigraphy was slow in  function and showed delayed adherence of both  kidneys compared to the  aorta.  The left kidney was small with delayed concentration of the  radiopharmaceutical.  Both kidneys had flat renogram curves with  markedly delayed and poor washout.  The relative functioning renal mass  was 69% on the right and 31% on the left.   PROCEDURES:  1. CT scan of the head without contrast.  2. Renal ultrasound.  3. Nuclear medicine renal imaging flow and function study.   COMPLICATIONS:  None.   CONDITION ON DISCHARGE:  She denied shortness of breath, chest pain, but  remained very forgetful with difficulty with names and detail questions.   RECENT PHYSICAL EXAMINATION:  VITAL SIGNS:  Blood pressure 102/57, pulse  52, respirations 20, temperature 97.8, oxygen saturation 97% on room-  air.  GENERAL:  She is an elderly white female who is in no apparent  distress  while sitting in a chair.  NECK:  Without jugular venous distention or carotid bruit.  CHEST:  Clear to auscultation.  HEART:  Irregularly irregular rhythm.  S1-S2 present with a grade 3/6  systolic ejection murmur.  ABDOMEN:  Normal bowel sounds and no tenderness.  EXTREMITIES:  Bilateral trace ankle edema.  NEUROLOGIC  She is alert and oriented x2, cranial nerves II-XII were  significant for moderately decreased hearing bilaterally.  Gait assessment:  She required moderate one-person assistance to change  from sitting position to a  standing position and required minimal  assistance to walk a short distance in her room.  She did tend to drag  her left lower extremity, apparently due to left hip pain.   PERTINENT LABORATORY STUDIES:  Serum sodium 138, potassium 3.7, chloride  106, carbon dioxide 24, BUN 47, creatinine 3.53, glucose 103, white  blood cells 8.7, hemoglobin 11.5, hematocrit 34.3, platelets 275, serum  protein electrophoresis showed 144 mg/day urine protein excretion, serum  total protein was 6.3 with albumin of 2.9, serum protein electrophoresis  showed no monoclonal spikes.  On October 03, 2008, BNP result was 1150,  serum ammonia was 17, urine culture was negative.  On July 12, 2008,  TSH was 3.1.   DISCHARGE DIAGNOSES:  1. Acute renal insufficiency, stabilized.  2. Chronic insomnia.  3. Depression.  4. Hypertension.  5. Coronary artery disease with ischemic cardiomyopathy.  6. Severe aortic stenosis.  7. Chronic atrial fibrillation.  8. History of subarachnoid bleed times 2.  9. History of hydrocephalus status post ventriculoperitoneal shunt      placement.  10.Anxiety.  11.Protein calorie malnutrition.  12.Osteoarthritis of the left hip.  13.Mild Alzheimer's disease.  14.Rheumatoid arthritis.  15.Hyperlipidemia.  16.Status post left hip fracture with surgical repair, September 2009.  17.Physical deconditioning.  18.Gait instability.  19.Anemia.  20.Delirium, resolved.   DISCHARGE MEDICATIONS:  Xanax 0.5 mg p.o. q.h.s., trazodone 25 mg p.o.  q.h.s. Toprol XL 50 mg p.o. every a.m., Norvasc 5 mg p.o. daily p.r.n.  for blood pressure systolic greater than 160, Xanax 0.25 mg p.o. t.i.d.  p.r.n. anxiety, Magic Cup twice daily, Tylenol 500 mg p.o. q.i.d. p.r.n.  pain, Nu-Iron 150 one tablet p.o. daily.  She was advised to stop taking  Lexapro and tramadol.   DISPOSITION:  Follow-up:  The plan is to transfer her to a skilled  nursing facility for rehabilitation of her physical  deconditioning and  gait instability.  We will monitor her renal function closely.  Hopefully she can transition back to  independent living with close assistance from family members, or to an  assisted-living facility, depending  on how her rehabilitation goes.   Note, the process of discharge required 40 minutes.           ______________________________  Barry Dienes [name], M.D.     DGP/MEDQ  D:  10/07/2008  T:  10/08/2008  Job:  [id number]   cc:   Cassell Clement, M.D.  Fax: [phone]   Antonietta Breach, M.D.   Dyke Maes, M.D.  Fax: [phone]

## 2011-01-25 NOTE — H&P (Signed)
NAMECAIDYN, BLOSSOM               ACCOUNT NO.:  [id number]   MEDICAL RECORD NO.:  [id number]          PATIENT TYPE:  INP   LOCATION:  3107                         FACILITY:  [facility]   PHYSICIAN:  Coletta Memos, M.D.     DATE OF BIRTH:  May 18, 1921   DATE OF ADMISSION:  09/08/2007  DATE OF DISCHARGE:                              HISTORY & PHYSICAL   ADMISSION DIAGNOSES:  1. Right traumatic subdural hematoma.  2. History of subarachnoid hemorrhage.  3. History of communicating hydrocephalus.   INDICATIONS:  Damonique Brunelle is an 77 year old who, while moving things  from upstairs to downstairs, tripped and fell on the stairs in her home.  She was found by either in a neighbor or family member who lives next  door.  There was a significant amount of blood in the house where she  found her.  It is unknown if Mrs. Shareef lost consciousness as she does  not remember, and the fall was not witnessed.  She was, however, alert,  oriented x4 and answering all questions when the friend-neighbor  arrived.  She was taken to the [facility] emergency room secondary to  the laceration on her head and the fall.  While in the [facility]  emergency room, she was noted have elevated blood pressure at well over  200.  She was also on CT noted to have a thin rim subdural on the right  side with some tentorial blood also.  Secondary to a history of a  subarachnoid hemorrhage and obstructive hydrocephalus, she had a  communicating hydrocephalus, she had a ventriculoperitoneal shunt  placed.  This was done by my partner, Dr. Shirlean Kelly, in 2004.  She  actually just saw Dr. Newell Coral approximately 2 weeks ago secondary to  complaints of dementia from the family.  Her family physician works in  [employer], Dr. Orson Aloe, I believe, who felt that she needed to  be seen by the neurosurgeon to make sure the shunt was still  operational.   Current medications include metoprolol succinate 25 mg once a day.   The  daughter stated that her blood pressure is a always high.  She states  that the family physician is aware of this, but she is treated with  metoprolol for the hypertension.  She had blood pressures of 201/118,  210/122, 208/136 in the [facility] emergency room.  After treatment,  she was down to 153/66.   She had laboratory studies performed, white blood cell count of 16.3,  hematocrit of 39.9.  Sodium 139, potassium 3.7, creatinine 1.07, BUN 18.   She had a CT of the cervical spine which showed no fractures,  spondylitic change.  No obvious problems.   Her a laceration was dressed but not repaired in the [facility]  emergency room.   The daughter, when she arrived at [facility], stated that her mother  had vomited several times and still felt somewhat nauseated but also  stated she felt that her mother was slightly confused after the fall but  felt that she was much more like herself afterwards.  Vital signs have been stable with pulse of 102-112, respiratory rate 16-  18, temperature 97.6, oxygen saturation 93-98%.   ALLERGIES:  She has allergy to ASPIRIN and CODEINE.   PAST MEDICAL HISTORY:  Significant for:  1. Aneurysm.  2. Coronary artery disease.  3. Hypertension.  4. She does not smoke.  She does not use alcohol.  She does not have a      history of illicit drug use.  5. Past medical history significant for the subarachnoid hemorrhage      status post endovascular coiling of the hemorrhage.  6. Also possible dementia in the recent past.   She was given 1 tablet of hydrocodone 5 mg 325, tetanus toxoid and  labetalol intravenously.  She has two IV catheters which have been  placed.   PHYSICAL EXAMINATION:  GENERAL:  She is alert, very hard of hearing,  answering all questions appropriately.  Speech is clear, fluent.  Memory, language, attention span, and fund of knowledge are normal.  She  is oriented to the date, situation, her address.  She has no drift  on  exam.  Normal strength in the upper and lower extremities.  Normal  muscle tone, bulk and coordination in the upper and lower extremities  while lying on a stretcher.  I did not try to have her sit up.  Full  extraocular movements, full visual fields.  Tongue and uvula are in the  midline.  Shoulder shrug is normal.  Pupils equal, round, and reactive  to light.  Full extraocular muscles were appreciated.  No blood in the  external auditory canal.  Nares appear to be normal.  She has a  laceration over right forehead and ecchymotic coloring around that  laceration with some edema of the soft tissue.   Head CT was reviewed.  What it shows is a thin-rimmed subdural without  mass effect on the right side.  There is some tentorial blood also.  I  did not appreciate a skull fracture on these on the head CT.  Cervical  spine film shows good alignment, no fractures, spondylitic changes as to  the expected in a woman who is 77 years of age.   I will admit Mrs. Burdette to the intensive care unit just for  observation.  I do not think she is in any danger as long as things stay  as they are.  I also do not believe that she needs to be given an  anticonvulsant.  I will repeat the CT scan tomorrow.  I will, if she is  still a hospital at the coming of the week, notify her physicians that  she is here.  Blood pressure probably needs to be better controlled.           ______________________________  Coletta Memos, M.D.     KC/MEDQ  D:  09/08/2007  T:  09/09/2007  Job:  [id number]

## 2011-01-25 NOTE — Discharge Summary (Signed)
NAMETYSHAY, ADEE               ACCOUNT NO.:  [id number]   MEDICAL RECORD NO.:  [id number]          PATIENT TYPE:  INP   LOCATION:  2925                         FACILITY:  [facility]   PHYSICIAN:  Danae Orleans. Venetia Maxon, M.D.  DATE OF BIRTH:  April 17, 1921   DATE OF ADMISSION:  09/08/2007  DATE OF DISCHARGE:  09/14/2007                               DISCHARGE SUMMARY   REASON FOR ADMISSION:  [name] is an 77 year old woman who fell at  home and was found to have a subdural hematoma.  She was admitted to the  hospital, was observed closely and treated by cardiology service with  some evidence of cardiomegaly with a left ventricular ejection fraction  of 55%.  She has history of tracheostomy.  She has initial agitation  which seemed to improve and she was oriented and working with physical  therapy as of the 2nd.  Home health services were initiated.  She was  discharged home with followup instructions and to follow up with Dr.  Patty Sermons in his office and follow up with Dr. Alvester Morin in 3-4 weeks.      Danae Orleans. Venetia Maxon, M.D.  Electronically Signed     JDS/MEDQ  D:  09/14/2007  T:  09/14/2007  Job:  [id number]

## 2011-01-25 NOTE — Consult Note (Signed)
[name], [name]               ACCOUNT NO.:  [id number]   MEDICAL RECORD NO.:  [id number]          PATIENT TYPE:  INP   LOCATION:  1518                         FACILITY:  [facility]   PHYSICIAN:  [name], M.D.  DATE OF BIRTH:  October 30, 1920   DATE OF CONSULTATION:  10/01/2008  DATE OF DISCHARGE:                                 CONSULTATION   REASON FOR CONSULTATION:  Psychosis.   HISTORY OF PRESENT ILLNESS:  [name] is an 77 year old female  admitted to the [facility] on September 29, 2008 due to  confusion in the context of renal failure.   [name] has developed approximately 8 days of progressive confusion.  She also has hallucinations and delusions.  Today, she believes that a  baby is in the bed with her.  She does not know where she is.  Please  see the mental status exam below.  She is not currently agitated or  combative.  She has impaired memory, as well as judgment.  She does have  a pleasant mood at this time.   The symptoms appear to have been correlated with her general medical  problem exacerbations.  They have not improved so far with her general  medical care.   She is on Lexapro 10 mg daily and does have a history of anxiety.  She  is also on Xanax 0.5 mg q.h.s. and has been started on Haldol 2 mg  q.h.s.   PAST PSYCHIATRIC HISTORY:  In review of the past medical record, Mrs.  [name] did have some insight on her initial presentation on September 29, 2008, stating that she had lost her mind.  Her insight has become worse  since then.   She does have a history of being on Namenda for diagnosed dementia.  This was discontinued on September 15, 2008.   She has had short-term memory recall difficulty and insomnia with some  weight loss over a number of months.   Family members had reported that Namenda was associated with odd dreams.  She was also tried on Aricept; however, she had nausea and vomiting.   FAMILY PSYCHIATRIC HISTORY:  None  known.   SOCIAL HISTORY:  [name]'s marital status is widowed.  She has been  living alone.  Her daughter does live next door.  Total number of  children 4 with 3 sons.  She has a number of grandchildren.  She does  not use alcohol or illegal drugs.  Occupation - retired.   PAST MEDICAL HISTORY:  1. History of total abdominal hysterectomy.  2. Appendectomy.  3. Cataract operations.  4. Left hip fracture in September.  5. History of VT shunt.  6. A temporary tracheostomy.  7. Cardiac pacemaker placement.  8. July 2004 aneurysm coiling.   MEDICATIONS:  The MAR is reviewed.  She is on:  1. Xanax 0.5 mg q.h.s.  2. Lexapro 10 mg daily for anxiety.  3. She is currently on Haldol 2 mg q.h.s.   ALLERGIES:  1. ASPIRIN.  2. CODEINE.  3. MORPHINE SULFATE.  4. SULFA.   LABORATORY DATA:  Sodium 140, BUN 36, creatinine 3.87, glucose 121,  ammonia 17.  WBC 8.3, hemoglobin 12.0, platelet count 324, SGOT 16, SGPT  8.   Head CT without contrast on September 29, 2008 showed bilateral renal  cortical thinning.  There was no hydronephrosis.   REVIEW OF SYSTEMS:  Psychiatric:  The patient does have a long-term  history of insomnia, feeling on edge, muscle tension which has been  treated with Lexapro.  [name] has received Ativan p.r.n. 0.5 mg for  agitation three times over the past 24 hours.  [name] is not able  to provide review of system.  The review of systems is gleaned from the  staff and the medical record.  Constitutional, head, eyes, ears, nose,  throat, mouth, neurologic, cardiovascular, respiratory,  gastrointestinal, genitourinary, skin, musculoskeletal, hematologic,  lymphatic, endocrine, metabolic all unremarkable.   PHYSICAL EXAMINATION:  VITAL SIGNS:  Temperature 98.0, pulse 60,  respiratory rate 20, blood pressure 186/102, O2 saturation on room air  96%.  GENERAL APPEARANCE:  [name] is an elderly female, partially  reclined in a supine position in her  hospital bed.  She does not have  any abnormal involuntary movements.  MENTAL STATUS EXAM:  She does have clouding of consciousness.  She has  decreased attention and decreased concentration.  Her eye contact is  intermittent.   Affect is mildly flat.  Mood is slightly euphoric.  She states that she  is feeling very well.  However, there is disorganization in much of her  speech.  At one point when asked where she is, she states where  everybody goes eventually.   On orientation testing, she appears to be intact to person only.  She  cannot perform formal mental status questions of memory, abstraction,  thought content.   Her global memory is grossly impaired.  She cannot answer the question  of where she is from.   Fund of knowledge and intelligence are below that of her estimated  premorbid baseline.  Her speech does involve normal articulation without  dysarthria.  There is a mildly flat prosody.  Thought process involves  some disorganization.  However, there are occasional coherent statements  as above and also where's the baby?   Thought content - she is having visual hallucinations.  Insight is poor,  judgment is impaired.   ASSESSMENT:  AXIS I:  1. 293.00 - delirium not otherwise specified.  2. 293.84 - anxiety disorder not otherwise specified (likely      idiopathic and general medical factors), rule out generalized      anxiety disorder.  Rule out dementia NOS which predated the      delirium.  AXIS II:  None.  AXIS III:  See past medical history.  AXIS IV:  General medical.  AXIS V:  20.   RECOMMENDATIONS:  1. Would continue with memory and orientation cues in the room.  2. Would recheck her EKG to confirm that the QTC is less than 500 ms,      and, if not, would stop the Haldol, rechecking the EKG to ensure      that it is less than 500 ms.  If her course of anti psychotic      treatment involves difficulty with elevated QTC on Haldol, she      could be  switched to Zyprexa.  Her most recent EKG QTC was 489 ms      on September 29, 2008.  3. Regarding her history of anxiety, concur with  continuing the      Lexapro at 10 mg daily.  It is unlikely that Lexapro would be      contributing to any of her delirium.  4. Would continue to monitor for any rigidity or other extrapyramidal      side effects of the Haldol.  Haldol is an excellent choice for anti      delirium.  Please see the above regarding rechecking QTC.  5. Would continue low stimulation ego support, reaffirming correct      orientation information.  6. No change in the Xanax for now.  However, if the delirium persists,      would discontinue Xanax.  7. Would confirm that a reversible memory dysfunction etiology workup      has been completed in the past which would involve B12, folic acid,      RPR, as well as a current TSH.  She did have a TSH in October which      was normal.   Thank you very much for asking the undersigned to participate in Mrs.  Nguyen's care.      [name], M.D.  Electronically Signed     JW/MEDQ  D:  10/01/2008  T:  10/01/2008  Job:  [id number]

## 2011-01-25 NOTE — Consult Note (Signed)
NAMESHELAGH, [name]               ACCOUNT NO.:  [id number]   MEDICAL RECORD NO.:  [id number]          PATIENT TYPE:  INP   LOCATION:  1512                         FACILITY:  [facility]   PHYSICIAN:  Antonietta Breach, M.D.  DATE OF BIRTH:  1921-08-06   DATE OF CONSULTATION:  10/10/2008  DATE OF DISCHARGE:                                 CONSULTATION   [name] is cooperative and pleasant.  She is not agitated or  combative.  She has no hallucinations or delusions.   She does maintain intact interests regarding her religion.   REVIEW OF SYSTEMS:  NEUROLOGIC:  She is not having any stiffness or  other adverse Haldol effects.  She does continue with memory  dysfunction.   WBC is 6.4, hemoglobin 10.3, platelet count 256.   EXAMINATION:  VITAL SIGNS:  Temperature 98.8, pulse 66, respiratory rate  20, blood pressure 112/60, O2 saturation room air 93%.  She is not  demonstrating any stiffness.   MENTAL STATUS EXAM:  [name] does continue with impairment in knowing  the year as well as the month.  Her short-term recall is still impaired.  Her affect is pleasant.  Her mood is normal.  She is very cooperative  and socially appropriate.  Her fund of knowledge and intelligence  continue to be below that of her estimated premorbid baseline.  Her  speech involves normal rate and prosody.  Thought process involves some  confabulation, overall coherent.  Thought content:  No thoughts of  harming herself or others, no delusions or hallucinations.  Insight is  poor, judgment is still impaired.   ASSESSMENT:  Axis I:  A.  294.9 Unspecified persistent mental disorder, not otherwise  specified.  She does appear to have a primary dementia.  B.  293.00 Delirium, not otherwise specified.  This is now clear.  C.  History of what is most likely generalized anxiety disorder.   RECOMMENDATIONS:  1. Concur with restarting her Lexapro for antianxiety.  2. Concur with a taper off of Haldol.   PRELIMINARY DISCHARGE PLANNING IF NEEDED:  1. Outpatient psychiatric followup is available at one of the clinics      attached to [facility], [facility]      or [facility].  2. Would continue to utilize trazodone 25 mg q.h.s. for insomnia with      caution about excessive sedation or hypotension.      Antonietta Breach, M.D.  Electronically Signed     JW/MEDQ  D:  10/10/2008  T:  10/10/2008  Job:  [id number]

## 2011-01-25 NOTE — Discharge Summary (Signed)
[name], [name]               ACCOUNT NO.:  [id number]   MEDICAL RECORD NO.:  [id number]          PATIENT TYPE:  INP   LOCATION:  5503                         FACILITY:  [facility]   PHYSICIAN:  Barry Dienes. [name], M.D.DATE OF BIRTH:  26-Dec-1920   DATE OF ADMISSION:  04/20/2008  DATE OF DISCHARGE:  04/23/2008                               DISCHARGE SUMMARY   PERTINENT FINDINGS:  The patient is an 77 year old Caucasian woman with  a complicated past medical history who developed fever, chills, and  weakness, so she presented to the emergency room for evaluation.  She  had leukocytosis in the emergency room and was started on Rocephin for  presumptive urinary tract infection.  She had moderate dyspnea on  exertion and a dry cough.  She denied abdominal pain, dysuria, or  frequency.   PAST MEDICAL HISTORY:  Atherosclerotic coronary artery disease,  abdominal aortic aneurysm last measured at 4.4-cm diameter,  hypertension, subdural hematoma in January 2009, chronic atrial  fibrillation with contraindication to Coumadin, critical aortic valve  stenosis 2004, and subarachnoid hemorrhage due to aneurysmal bleed that  required a tracheostomy.  She developed communicating hydrocephalus and  had a shunt placed for this, atherosclerotic peripheral vascular  disease, bilateral sensorineural hearing loss, and mild short-term  memory deficits.   ALLERGIES:  ASPIRIN, CODEINE, and SULFA.   MEDICATIONS PRIOR TO ADMISSION:  Amlodipine 10 mg daily.  It was unclear  as to whether or not she was on metoprolol extended release 25 mg daily.   SOCIAL HISTORY:  She has been a widow for several years.  She has 1  daughter and 2 sons.  One daughter lives next door to her.   REVIEW OF SYSTEMS:  She had not had recent chest pain or dysuria.  She  generally walks with a cane.   INITIAL PHYSICAL EXAM:  VITAL SIGNS:  Temperature 101.4, blood pressure  115/58, pulse 123, pulse oxygen saturation 90% on room  air that  increased to 97% with 2 L of nasal cannula oxygen.  GENERAL:  She is an elderly white female who is in no acute distress.  HEENT:  Within normal limits.  CHEST:  Minimal inspiratory crackles.  HEART:  Had an irregularly irregular rhythm with a systolic ejection  murmur of grade 3/6 at the left sternal border.  ABDOMEN:  Normal bowel sounds and no hepatosplenomegaly or tenderness.  EXTREMITIES:  Bilateral trace ankle edema.   INITIAL LABORATORY STUDIES:  Serum sodium 137, potassium 3.4, chloride  100, carbon dioxide 27, BUN 21, creatinine 1.19, glucose 125, and  calcium 9.0.  Total protein 6.1, albumin 3.5, AST 15, BNP 211, CK-MB  less than 1.0, and troponin I less than 0.05.  EKG showed atrial  fibrillation with old septal Q waves and some inferolateral ST-segment  depression.  Urinalysis had 11-20 white blood cells with few bacteria.  White blood cell count was 15.5 with 91% segs.  Hemoglobin 12.4 and  platelets 294.  CT angiogram of the chest showed no evidence of  pulmonary embolism.  There was cardiomegaly with mitral valvular  calcifications and prominent coronary artery  calcifications.  Within the  left upper lung zone, there was an ill-defined 1.5 x 1.4 x 0.8-cm area  of consolidation of unclear etiology.  There were tiny nodules scattered  throughout the lungs bilaterally and an increased number of normal sized  slightly prominent mediastinal lymph nodes with the largest lymph node  in the aortopulmonary window measuring 1.5 x 1.7 cm.  There were mild  degenerative changes in the thoracic spine and there was mild  hyperplasia of the left adrenal gland.   HOSPITAL COURSE:  The patient was admitted to a medical bed without  telemetry.  She was started on IV Rocephin for possible urinary tract  infection.  Her urine culture, however, was negative.  She had a D-dimer  test done, which measured 2.79, so bilateral DVT ultrasound exam was  done that showed no evidence of  DVT.  She had adjustment in her  medications with discontinuation of amlodipine and gradual increase in  Toprol-XL for critical aortic valve stenosis.  Gradually, her symptoms  improved.  During her hospitalization preexercise, her pulse oxygen  saturation was 98% on 2 L per minute of oxygen.  Immediately, following  exercise her pulse oxygen saturation was 86% on room air.   PROCEDURES:  Chest x-ray shows stable cardiomegaly with chronic  interstitial lung markings April 20, 2008.  CT angiogram of the chest  and August 9 renal ultrasound showed the right kidney measuring 11.9 cm  in length with a left kidney measuring 9.6 cm.  The bladder was  unremarkable.  There was a simple cyst from the lower pole of the left  kidney measuring 3.0 x 2.6 x 2.3 cm.  It was felt to be cortical  thinning of both kidneys with more pronounced atrophy of the left  kidney.  Of note, September 10, 2007, two-dimensional echocardiogram  showed left ventricular ejection fraction of 60-65% with no regional  wall motion abnormalities and moderate left ventricular hypertrophy.  There was aortic valve stenosis with estimated aortic valve area 0.65  sq. cm.  There was mild-to-moderate tricuspid regurgitation.   CONDITION ON DISCHARGE:  She continues to have a mild cough that is  worse when she is lying supine.  She has mild dyspnea on exertion that  improves with nasal cannula oxygen.  She has not had recent fever or  chills.   Most recent physical exam shows vital signs with blood pressure 125/74,  pulse 86, respirations 20, temperature 98, and pulse oxygen saturation  98% on 2 L per minute of nasal cannula oxygen.  In general, she is an  elderly white female with an occasional dry cough, but no shortness of  breath on oxygen.  Chest had minimal bibasilar crackles with no  wheezing.  Heart had an irregularly irregular rhythm with a systolic  ejection murmur of grade 3/6 at the left sternal border.  There was no   gallop.  Abdomen had normal bowel sounds and no hepatosplenomegaly or  tenderness.  She had bilateral trace ankle edema.  She was able to walk  with a walker independently.   MOST RECENT LABORATORY STUDIES:  Serum sodium 142, potassium 3.9,  chloride 105, carbon dioxide 28, BUN 13, creatinine 1.06, and glucose  89.  White blood cells 9.8, hemoglobin 12.4, hematocrit 36, and  platelets 312.  BNP 461.   DISCHARGE DIAGNOSES:  1. Community-acquired pneumonia in the left upper lobe.  2. Acute on chronic heart failure due to diastolic dysfunction.  3. Critical aortic valve stenosis.  4. Osteoarthritis of the lumbar spine.  5. Coronary artery disease.  6. Abdominal aortic aneurysm.  7. Hypertension.  8. History of subdural hematoma in January 2009.  9. 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  10.Atherosclerotic peripheral vascular disease.  11.Chronic atrial fibrillation.  12.Bilateral sensorineural hearing loss.  13.Mildly impaired short-term memory.  14.Mild gait instability.   DISCHARGE MEDICATIONS:  1. Toprol-XL 25 mg 2 tablets p.o. every a.m. and 1 tablet p.o. every      p.m.  2. Maxzide 25 one-half tablet p.o. q.a.m.  3. Tessalon 100 mg p.o. t.i.d. p.r.n. cough.  4. Tylenol 650 mg p.o. t.i.d. p.r.n. pain.  5. Nasal cannula oxygen at 2 L per minute overnight and with walking      and p.r.n. dyspnea.  6. Doxycycline 100 mg 1 tablet p.o. b.i.d. for 7 days.   SPECIAL INSTRUCTIONS:  She is advised to not add salt to her food and to  use her walker with a seat whenever ambulating.  She was also advised to  check her body weight today and daily, and to call if her weight changes  3 pounds from today's weight.   DISPOSITION AND FOLLOWUP:  She will be discharged to home today.  She  will have initial home health care monitoring of her congestive heart  failure and gait  instability.  She should have a followup visit with Dr.  Jarome Matin at [facility] in approximately 1 week  and was given a telephone number to call and schedule that appointment.   The process of discharge required 35 minutes.           ______________________________  Barry Dienes [name], M.D.     DGP/MEDQ  D:  04/23/2008  T:  04/23/2008  Job:  [id number]   cc:   Cassell Clement, M.D.

## 2011-01-28 NOTE — Cardiovascular Report (Signed)
[facility]  Patient:    [name], [name]                      MRN: [id number] Proc. Date: 02/10/00 Adm. Date:  [id number] Attending:  Rudean Hitt CC:         Thomas A. Patty Sermons, M.D.             Barry Dienes [name], M.D.                        Cardiac Catheterization  INDICATIONS FOR PROCEDURE:  Angina with an abnormal ECG in a patient with multiple cardiovascular risk factors.  COMMENTS ABOUT PROCEDURE:  The right iliac was entered but was demonstrated to be totally occluded.  A 6-French dilator was left in the artery and flushed periodically.  The left groin was prepped and draped and was entered without difficulty, and a 6-French sheath was placed in this artery.  Angiograms were made using 6-French catheters.  The aortic valve was very difficult to cross and required straight guide wire with pigtail, multipurpose A and B, and eventually was crossed using an Amplatz A2 catheter with a straight guide wire.  Gradient was measured, and then a pigtail was exchanged for this.  A 30-cc ventriculogram was performed, and then a distal aortic injection was done.  The patient was returned to the holding area in stable condition.  HEMODYNAMIC DATA:  Aorta post contrast 150/73.  LV post contrast 155-160/11. Aortic valve gradient was thought to be around 10 mm.  ANGIOGRAPHIC DATA:  Left ventriculogram:  Performed in the 30-degree RAO projection.  There is dense mitral annular calcification noted.  The left ventricle was normal in size with normal wall motion.  There was a mildly calcified aortic valve noted.  No significant mitral regurgitation was present.  Distal aortic injection reveals bilateral renal arteries.  There is an abdominal aortic aneurysm infrarenal with an occlusion of the right iliac. There is diffuse aneurysmal disease of the left iliac.  Coronary arteries arise and distribute normally, mildly calcified proximally. The  right coronary artery was severely calcified at its ostium. 2. The left main coronary artery appears normal. 3. The left anterior descending artery contains scattered irregularities but    no significant disease is identified. 4. The circumflex coronary artery has two marginal arteries.  The second has a    50% stenosis in its proximal portion. 5. The right coronary artery is calcified proximally with mild irregularities    throughout the artery.  IMPRESSION: 1. Mild gradient across the aortic valve.  The valve is likely eccentric and    opens eccentrically. 2. Normal left ventricular function. 3. Dense mitral annular calcification. 4. Mild to moderate coronary artery disease. 5. Peripheral vascular disease with abdominal aortic aneurysm and occlusion of    right iliac artery. DD:  02/10/00 TD:  02/10/00 Job: [id number]

## 2011-01-28 NOTE — Op Note (Signed)
   [name], [name]                         ACCOUNT NO.:  [id number]   MEDICAL RECORD NO.:  [id number]                   PATIENT TYPE:  INP   LOCATION:  3108                                 FACILITY:  [facility]   PHYSICIAN:  Lesleigh Noe, M.D.            DATE OF BIRTH:  [birth year]   DATE OF PROCEDURE:  04/05/2003  DATE OF DISCHARGE:                                 OPERATIVE REPORT   INDICATIONS FOR PROCEDURE:  Profound bradycardia in this patient with atrial  fibrillation and recent subarachnoid hemorrhage. Bradycardia is felt to be  secondary to hypothermia, digoxin, beta blocker therapy and intrinsic AV  node disease, AV block.   PROCEDURE PERFORMED:  Transvenous pacemaker insertion via the right femoral  vein under fluoroscopic guidance.   DESCRIPTION OF PROCEDURE:  After informed consent was obtained from the  patient's family, a 6 French sheath was placed in the right femoral vein  using the modified Seldinger technique. A single anterior wall stick was  performed on the right femoral vein. After sheath placement  a 6 French  balloon  tipped pacing lead was advanced under fluoroscopic guidance to the  right ventricular apex.   In what was felt to be an adequate position, we established a threshold of  less than 1 milliamp. We set the rate at 70 and the MA at 3. No  complications occurred.   CONCLUSION:  Successful  placement  of temporary transvenous pacemaker via  the right femoral vein approach under fluoroscopic guidance with adequate  threshold and complete capture.    PLAN:  Continue pacing until intrinsic rate supercedes that of the  pacemaker. Hold any medication that may cause bradycardia.                                                   Lesleigh Noe, M.D.    HWS/MEDQ  D:  04/05/2003  T:  04/06/2003  Job:  [id number]   cc:   Cassell Clement, M.D.  [address], [address]  [place]  [place] [address]  Fax: [phone]   Hewitt Shorts,  M.D.  [address]  [place]  [place] [address]  Fax: [phone]

## 2011-01-28 NOTE — Discharge Summary (Signed)
[name], [name]                         ACCOUNT NO.:  [id number]   MEDICAL RECORD NO.:  [id number]                   PATIENT TYPE:  INP   LOCATION:  3020                                 FACILITY:  [facility]   PHYSICIAN:  Hewitt Shorts, M.D.            DATE OF BIRTH:  04/24/1921   DATE OF ADMISSION:  04/04/2003  DATE OF DISCHARGE:  05/09/2003                                 DISCHARGE SUMMARY   HISTORY OF PRESENT ILLNESS:  The patient is an 77 year old woman who on the  evening of admission developed a sudden severe headache. She was taken by  EMS to the emergency room and evaluated by the emergency room physician. CT  scan of the brain revealed subarachnoid hemorrhage. The patient was seen in  neurosurgical consultation. The patient had a history of hypertension,  atrial fibrillation, peptic ulcer disease, and migraines. Only previous  surgery was a hysterectomy. She had no allergies to medications, and  medications prior to admission include digoxin, Coumadin, Neurontin,  Norvasc, and lisinopril.   PHYSICAL EXAMINATION:  GENERAL:  Revealed a pulse of 69, blood pressure  164/96.  LUNGS:  Had mild rhonchi.  HEART:  Irregular irregular rhythm with a 2-3/6 systolic murmur.  NECK:  2+ meningismus.  NEUROLOGICAL:  Showed the patient was lethargic, could open her eyes to  voice. She was oriented to her name, [name], and seventh month  of 2004. She followed commands. Her speech was fluent. She had good  comprehension. Cranial nerves showed pupils to be 1.5 mm, bilaterally round  and reactive to light. Extraocular movements are intact. Her face was  symmetrical. Motor examination showed good strength in all four extremities.  Notably, the patient had been given Stadol, Phenergan, and Zofran for her  headaches, nausea, and vomiting which had sedated her.   HOSPITAL COURSE:  The patient was admitted. She was found to be  therapeutically anticoagulated, and the  anticoagulation was reversed with  fresh frozen plasma and vitamin K. The patient was then sent to angiography  where two large aneurysms were found. Dr. Corliss Skains initially coiled one of  the two aneurysms; however on further review of the arteriogram, it was felt  best to have the second aneurysm coiled as well, and the patient returned to  the angiogram suite for such. The patient had some symptomatic bradycardia  and a temporary pacemaker was placed by the cardiology service. Eventually,  the heart rate stabilized, and the external pacemaker could be discontinued.  Followup CT scan revealed increased ventricular size consistent with  hydrocephalus, and on April 06, 2003, the patient underwent placement of a  right frontal intraventricular catheter which connected to drainage. The  patient was started on Nimotop which was continued for four weeks following  her subarachnoid hemorrhage. The patient was weaned from ventilator, and two  extubation attempts were made. The patient had difficulty with vocal cord  edema and upper  airway obstruction and each time had to be reintubated and  eventually required tracheostomy. TCDs were monitored, and the patient had  good drainage from the intraventricular catheter. The patient was supported  nutritionally with tube feedings. Followup CT scans showed stabilization of  the ventricular size. We were able to gradually raise the level of the drip  chamber of the intraventricular catheter, clamp the catheter, and then  monitoring CT scans which showed stable ventricular size, discontinue the  intraventricular catheter, and followup CT scans have shown stable  ventricular size. Critical care medicine consulted, Dr. Allegra Grana, for  tracheostomy which was performed on August 5 of 2004, and the tracheostomy  was managed by Dr. Sung Amabile from the critical care medicine service.  Eventually, he was able to wean the tracheostomy and decannulate her a  couple of  weeks after the tracheostomy, and she has done well following the  decannulation. The patient was seen in consultation by physical therapy,  occupational therapy, and speech therapy and has made significant neurologic  recovery. TCDs remained stable through the course, and patient is at this  point awake, alert, oriented, following commands, conversant, moving all  extremities well, and ambulating. We have been gradually able to discontinue  various medications. At this time, her only medication is digoxin 0.125 mg  q.d. for control of her atrial fibrillation ventricular response rate; her  cardiologist is Dr. Patty Sermons. The patient underwent studies with speech  therapy for modified barium swallow, and we were able to eventually initiate  a diet and subsequently discontinue her tube feedings and Panda tube, and at  this time, the patient is eating on her own with mild assistance. The  patient is being transferred to the rehabilitation unit for comprehensive  inpatient rehabilitation to subsequently to return to live with her family.   DISCHARGE DIAGNOSES:  1. Aneurysmal subarachnoid hemorrhage.  2. Two large cerebral aneurysms status post endovascular coiling.  3. Chronic atrial fibrillation, controlled with digoxin. It should be noted     that the patient is not a candidate for anticoagulation therapy as     regards to her atrial fibrillation.                                                Hewitt Shorts, M.D.    RWN/MEDQ  D:  05/09/2003  T:  05/10/2003  Job:  [id number]   cc:   Cassell Clement, M.D.  [address]  [place]  [place] [address]  Fax: [phone]

## 2011-01-28 NOTE — Consult Note (Signed)
[name], [name]                         ACCOUNT NO.:  [id number]   MEDICAL RECORD NO.:  [id number]                   PATIENT TYPE:  EMS   LOCATION:  MINO                                 FACILITY:  [facility]   PHYSICIAN:  Mark A. Perini, M.D.                DATE OF BIRTH:  08/29/1921   DATE OF CONSULTATION:  05/24/2003  DATE OF DISCHARGE:  05/24/2003                                   CONSULTATION   CHIEF COMPLAINT:  Falls, trouble walking.   HISTORY OF PRESENT ILLNESS:  [name] is an 77 year old female with a  recent prolonged complicated admission for subarachnoid hemorrhage status  post coiling of two intracranial aneurysms. This admission started in late  July of 2004, and she eventually discharged three days ago on May 21, 2003 from the rehab service. Prior to discharge, she was able to ambulate  significant distances with her walker, and she was felt stable for discharge  home. She did okay at home for the first couple of days; however, in the  last two days, she has had three or four falls. She denies any syncope,  chest pain, shortness of breath, or palpitations. She states that her legs  just give way, and she goes down. One or two of the falls have occurred with  her walker, one occurred when her sister was helping her, and one may have  occurred when she was alone without any assistive device. She says that her  left leg gets a little wobbly and weak and then her right leg does and then  she goes down. There has been no report of any new fever. She has had no  nausea or vomiting. No change in her appetite. She has been moving her  bowels normally. There has been no blood above or below. She denies any new  headaches or visual disturbances. Furthermore, there have been no focal  neurologic weakness symptoms. She does have chronic lower extremity pain due  to peripheral neuropathy which are unchanged from over many months.   PAST MEDICAL HISTORY:  1. The patient  was discharged on May 21, 2003 from rehab unit. She had     a subarachnoid hemorrhage on April 05, 2003. She had coiling of an     aneurysm and then went back and had a second aneurysm coiled. On July 25,     she had an intraventricular catheter placed for hydrocephalus. This was     eventually removed. She also required brief transvenous pacing during her     acute illness. She also had trouble with focal cord edema and airway     obstruction and required tracheostomy. This was eventually removed on     May 02, 2003. She did have bradycardia at that time as well.  2. She also has a history of hypertension.  3. History of atherosclerotic peripheral vascular disease with bilateral  iliac occlusions.  4. She has a history of peripheral neuropathy.  5. She did have a proteus UTI during her last admission.  6. She has a history of chronic atrial fibrillation but is no longer going     to be anticoagulated due to her recent intracranial bleed.  7. She has a history of a AAA.  8. History of migraine headaches.  9. History of anxiety and depression.  10.      History of peptic ulcer disease.  11.      History of hysterectomy.  12.      History of atherosclerotic coronary disease by a catheterization     done in 2001 which showed mild coronary irregularities in 2001. She had     mild aortic stenosis and mitral regurgitation.   ALLERGIES:  SULFA, CODEINE, and MORPHINE. Further, she is not to be given  aspirin or Coumadin at any time in the near future and possibly never.    MEDICATIONS:  1. Lanoxin 0.125 mg daily.  2. Neurontin 200 mg t.i.d.  3. Xanax 0.25 mg t.i.d. p.r.n.   SOCIAL HISTORY:  She has been a widow since 2000. She has no alcohol or  tobacco use. She lives alone in [place] but lives next to her daughter,  [name], who is very supportive, and her sister, [name], has been living with  her recently. She has two sons as well.   FAMILY HISTORY:  Noncontributory.    REVIEW OF SYSTEMS:  As per history of present illness.   PHYSICAL EXAMINATION:  VITAL SIGNS:  Temperature 98.7 orally, blood pressure  107/61, pulse 88, respiratory rate 18, 97% saturation on room air.  GENERAL:  She is lying supine in no acute distress, alert and oriented x4,  neurologically intact.  HEENT:  Pupils are equal, round, and reactive to light. No icterus. There is  possibly light pallor noted. Cranial nerves II-XII are intact.  LUNGS:  She is clear to auscultation in the upper lobes, perhaps a little  bit decreased breath sounds in the lower lobes bilaterally but no wheezes  rales or rhonchi.  HEART:  Regular rate and rhythm with a 2/6 murmur at the left sternal border  in systole with a 1/6 murmur at the right sternal border. There is no rub or  gallop noted. '  EXTREMITIES:  There is no peripheral edema. She moved extremities x4.  NEUROLOGICAL:  She has 5-/5 strength throughout the upper and lower  extremities. She has 1+ deep tendon reflexes throughout the upper and lower  extremities and downgoing Babinski signs bilaterally. Speech is normal.   LABORATORY DATA:  Sodium 140, potassium 4.4, chloride 106, CO2 28, BUN 16,  creatinine 1.6, glucose 86. White count is 11.4 which is up from 6.0 several  days ago, hemoglobin is 11.2 is down from 12.0 several days ago, platelet  count of 324,000. The pH is 7.42, pCO2 42, bicarb 27. CT scan of the head  showed no acute change or no acute abnormality. INR is 1.1, PTT 23.   ASSESSMENT/PLAN:  An 77 year old female with gait instability and falls  status post prolonged hospital and rehabilitation unit stay. This is  probably multifactorial, and there is a component of her peripheral  neuropathy and deconditioning going on here. Furthermore, she may have some  other acute process superimposed such as a urinary tract infection or a  lower respiratory tract infection. She has no focal signs or symptoms of stroke at this time. Her CT  scan  of her head was negative for any acute  change. The patient strongly desires no readmission at this time. Therefore,  we will check a urinalysis and urine culture and also obtain a PA and  lateral chest x-ray. We will then empirically treat her with seven days of  Avelox 400 mg daily and Magic mouthwash to prevent oral yeast. She is to  follow up early next week with Dr. [name] in our office. Furthermore, she  will likely benefit from scheduling outpatient home physical therapy and  occupational therapy to evaluate and treat.                                               Mark A. Waynard Edwards, M.D.    MAP/MEDQ  D:  05/24/2003  T:  05/26/2003  Job:  [id number]   cc:   Barry Dienes. [name], M.D.  [address]  [place]  [place] [address]  Fax: [phone]   Hewitt Shorts, M.D.  [address]  [place]  [place] [address]  Fax: [phone]   Cassell Clement, M.D.  [address], [address]  [place]  [place] [address]  Fax: [phone]   Di Kindle. Edilia Bo, M.D.  [address]  [place]  [place] [address]   Ranelle Oyster, M.D.  [address] [place]  [place] [address]  Fax: [phone]

## 2011-01-28 NOTE — Discharge Summary (Signed)
   [name], [name]                         ACCOUNT NO.:  [id number]   MEDICAL RECORD NO.:  [id number]                   PATIENT TYPE:  INP   LOCATION:  3006                                 FACILITY:  [facility]   PHYSICIAN:  Hewitt Shorts, M.D.            DATE OF BIRTH:  Mar 07, 1921   DATE OF ADMISSION:  07/03/2003  DATE OF DISCHARGE:  07/07/2003                                 DISCHARGE SUMMARY   ADMISSION PHYSICAL EXAMINATION:  The patient is an 77 year old woman who  suffered and aneurysmal subarachnoid hemorrhage this past summer.  She had a  prolonged hospital course which is detailed in my admission history and  physical examination.  She was subsequently discharged to the rehabilitation  unit and then to home with her family.  However, family noted deteriorating  gait, number of falls, poor memory and incontinence and she was evaluated  for possibility for communicating hydrocephalus with CT scan and lumbar  puncture and decision was made to place a right ventriculoperitoneal shunt.  Details of the history and physical examination are included in my admission  note   HOSPITAL COURSE:  The patient was admitted and underwent placement of a  right frontal ventriculoperitoneal shunt with programmable a Codman valve.  Pressure was set to 90 mmHg.  Procedure itself was tolerated well.  She had  a little bit of vomiting following surgery with changes of position.  That  resolved.  Followup CT scan showed the catheter in good position and it was  felt that there may be some slight decrease in the ventricular size.  Her  wounds have been healing well.  She has had some mild discomfort around the  abdominal  incision area.  She is afebrile and she is up and mobile with  assistance.  She is to return at the end of the week for staple removal. She  has been given instructions along with granddaughter regarding wound care  and activities.  She is to return at the end of the week for  staple removal.   DISCHARGE DIAGNOSIS:  Communicating hydrocephalus.                                                 Hewitt Shorts, M.D.    RWN/MEDQ  D:  07/07/2003  T:  07/07/2003  Job:  [id number]

## 2011-01-28 NOTE — H&P (Signed)
[name], [name]                         ACCOUNT NO.:  [id number]   MEDICAL RECORD NO.:  [id number]                   PATIENT TYPE:  INP   LOCATION:  2889                                 FACILITY:  [facility]   PHYSICIAN:  Hewitt Shorts, M.D.            DATE OF BIRTH:  06/23/1921   DATE OF ADMISSION:  07/03/2003  DATE OF DISCHARGE:                                HISTORY & PHYSICAL   HISTORY OF PRESENT ILLNESS:  The patient is an 82-yaer-old, right-handed  white female who presented three months ago with an acute and sudden severe  headache.  She was evaluated in the emergency room and found by CT scan to  have suffered a subarachnoid and an intraventricular hemorrhage.  She  underwent an arteriography and was found to have two large intracranial  aneurysms.  She underwent a coiling of both of the aneurysms.  Subsequently  she had an intraventricular catheter placed for the treatment of  hydrocephalus.  She had a prolonged hospitalization with ventilator  dependency.  She underwent a tracheostomy.  She subsequently was able to be  weaned from the ventilator.  She underwent rehabilitation in the [facility].  She was decannulated from her  tracheostomy and she returned to live with her family; however, she returned  for an evaluation.  Earlier this month her family noted increased difficulty  since returning home from rehabilitation, with deteriorating gait, a number  of falls, a poor memory, and incontinence, which she did not previously  have.  A CT scan of the brain was obtained, which showed a ventricular  enlargement, comparable to her sulcal enlargement.  The case was reviewed  with my partners, Dr. Cristi Loron and Dr. Coletta Memos.  A decision  was made to proceed with a lumbar puncture, which was performed as an  outpatient.  The pressure was not elevated; however, the family noted that  her condition did improve following a  lumbar puncture for about one day, and  then worsened once again.  An incision was made in conjunction with the  family, to proceed with a ventricular peritoneal shunting.   PAST MEDICAL/SURGICAL HISTORY:  Were reviewed in her previous admission  note.   ALLERGIES:  SULFA, MORPHINE, CODEINE AND ASPIRIN.   CURRENT MEDICATIONS:  1. Neurontin 100 mg t.i.d.  2. Digoxin 0.125 mg daily.   FAMILY HISTORY/SOCIAL HISTORY:  Are reviewed in the previous admission note.   REVIEW OF SYSTEMS:  Notable for those described above.  Otherwise  unremarkable.   PHYSICAL EXAMINATION:  GENERAL:  The patient is a well-nourished, well-  developed white female, who appears her stated age.  VITAL SIGNS:  Temperature 97.5 degrees, pulse 88, blood pressure 124/71,  respirations 16.  Height 5 feet 4 inches, weight 138 pounds.  LUNGS:  Clear to auscultation.  She had symmetrical respiratory excursion.  HEART:  A regular rate  and rhythm.  Normal S1, S2.  There is no murmur.  ABDOMEN:  Soft, nondistended.  Bowel sounds are present.  EXTREMITIES:  No clubbing, cyanosis, or edema.  NEUROLOGIC:  Shows a patient who is awake, alert and oriented to her name,  [name], and to 2004.  Her attention is fair.  Her short-term memory is  0/3 objects at three minutes.  Motor examination shows she moves all four  extremities with reasonable strength.   IMPRESSION:  Communicating hydrocephalus.   PLAN:  The patient will be admitted for the placement of a right frontal  ventricular peritoneal programmable shunt.  We have discussed on several  occasions with the patient and with her daughter, Aurther Loft, the nature of her  condition, the options for treatment, including the placement of a shunt;  and the risks of surgery, including the risks of infection, bleeding  including an intracranial hemorrhage requiring surgery, the risks of stroke,  brain damage, death and the anesthetic risks.  After discussing all of this, they  would like to go ahead with surgery, and  the patient is admitted for such.                                                   Hewitt Shorts, M.D.    RWN/MEDQ  D:  07/03/2003  T:  07/03/2003  Job:  [id number]

## 2011-01-28 NOTE — Op Note (Signed)
   [name], Diamond                         ACCOUNT NO.:  [id number]   MEDICAL RECORD NO.:  [id number]                   PATIENT TYPE:  INP   LOCATION:  3108                                 FACILITY:  [facility]   PHYSICIAN:  Hewitt Shorts, M.D.            DATE OF BIRTH:  1920-09-21   DATE OF PROCEDURE:  04/06/2003  DATE OF DISCHARGE:                                 OPERATIVE REPORT   PREOPERATIVE DIAGNOSIS:  Obstructive hydrocephalus.   POSTOPERATIVE DIAGNOSIS:  Obstructive hydrocephalus.   PROCEDURE:  Placement of right frontal intraventricular catheter.   SURGEON:  Hewitt Shorts, M.D.   ANESTHESIA:  IV sedation and 1% Xylocaine local anesthetic.   INDICATIONS FOR PROCEDURE:  The patient is an 77 year old woman who  presented with an acute subarachnoid hemorrhage a day and a half ago. She  was found by angiogram to have 2 cerebral aneurysms. Those were  endovascularly coiled. Follow up CT scans revealed progressive enlargement  of the ventricles consistent with hydrocephalus. The decision was made to  proceed with placement  of an intraventricular catheter.   DESCRIPTION OF PROCEDURE:  At the patient's bedside the right frontal scalp  was shaved and then prepped with Betadine solution and draped in a sterile  fashion. The scalp was infiltrated with local anesthetic, 1% Xylocaine.   Then a 1-cm in length parasagittal incision was made in the right mid  pupillary line. The incision was carried down to the periosteum and then a  twist-drill hole was made. A ventricular catheter was passed into the right  frontal horn. Opening pressure was approximately  15 cm of water. The  catheter was tunneled subcutaneously and brought out through a separate stab  incision.   The incision itself was closed with the surgical staples. The catheter was  secured to the scalp with a 3-0 nylon suture. The catheter was secured to  the scalp with a 3-0 nylon suture. The catheter was  connected to a closed  collection system and pink CSF drained well. The IVC is being set to drain  at 0 cm of water. The procedure was tolerated well.                                               Hewitt Shorts, M.D.    RWN/MEDQ  D:  04/06/2003  T:  04/06/2003  Job:  [id number]

## 2011-01-28 NOTE — Discharge Summary (Signed)
NAMEJESSICA, CHECKETTS               ACCOUNT NO.:  [id number]   MEDICAL RECORD NO.:  [id number]          PATIENT TYPE:  INP   LOCATION:  5525                         FACILITY:  [facility]   PHYSICIAN:  Barry Dienes. [name], M.D.DATE OF BIRTH:  March 07, 1921   DATE OF ADMISSION:  07/12/2008  DATE OF DISCHARGE:  07/17/2008                               DISCHARGE SUMMARY   PERTINENT FINDINGS:  The patient is an 77 year old Caucasian woman with  a history of coronary artery disease, congestive heart failure, critical  aortic stenosis, subarachnoid hemorrhage, a ventriculoperitoneal shunt,  who presented to the emergency room with altered mental status for 2-3  days prior to admission.  At baseline, she has short-term memory loss,  but is able to live alone with a fairly good quality of life.  Her  daughter lives next door and has been out of town for the past week.  Generally, her daughter subsidize her medications.  Her other family  members state that for the past few days prior to admission she had  increasing confusion.  On the day of admission, she was very disoriented  and asking about deceased relatives.  She actually called the family  member to complain that her children would not go to school.  In  addition, her unsteady gait was worse than usual.  911 was called and  EMS found her initial heart rate to be in the 30s.  They administered  atropine increasing her heart rate to the 40s to 50s.  In the emergency  room, she was moderately disoriented.  She had a head CT scan that  showed no acute abnormalities and her vital signs were normal.  Her  initial laboratory studies suggested a urinary tract infection.   MEDICATIONS PRIOR TO ADMISSION:  1. Norvasc 5 mg daily.  2. Toprol-XL 2 tablets in the morning and 1 tablet in the evening.  3. Lexapro 10 mg 4 times daily.  4. Lasix 20 mg reportedly 4 times daily.  5. Xanax 0.5 mg nightly.  6. Darvocet-N 100 p.r.n.   PAST MEDICAL HISTORY:  1.  Coronary artery disease.  2. Ischemic cardiomyopathy with ejection fraction 35-40% and 60-65% in      December 2008.  3. Critical aortic valve stenosis with aortic valve area 0.68 cm2.  4. Atrial fibrillation, not on anticoagulation due to prior      subarachnoid hemorrhage.  5. Subdural hematoma in January 2009.  6. Subarachnoid hemorrhage in 2004 with aneurysm coiling and VP shunt      due to hydrocephalus.  7. Abdominal aortic aneurysm measuring 4.4 cm.  8. Peripheral vascular disease.  9. Mild dementia.  10.Anxiety.  11.Rheumatoid arthritis.  12.Hyperlipidemia.  13.Left hip fracture with left hemiarthroplasty in September 2009 and      brief skilled nursing facility stay.   INITIAL PHYSICAL EXAMINATION:  VITAL SIGNS:  Blood pressure 119/60,  pulse 43-54, respirations 20, and temperature 98.7.  GENERAL:  She was agitated and disoriented and trying to get out of bed.  HEAD, EYES, EARS, NOSE AND THROAT:  Within normal limits.  NECK:  Supple with bilateral  soft carotid bruits.  HEART:  An irregular rhythm with a systolic ejection murmur, grade 3/6.  CHEST:  Bibasilar crackles.  ABDOMEN:  Benign.  EXTREMITIES:  Without edema.  NEUROLOGIC:  She was alert, but confused and could move all extremities.  She had moderate decreased hearing.   INITIAL LABORATORY STUDIES:  White blood cell count 7.3, hemoglobin  11.3, and platelets 279.  Serum sodium 137, potassium 3.4, chloride 101,  bicarbonate 29, BUN 18, creatinine 1.2, and glucose 76.  Urinalysis had  7-10 white blood cells with positive nitrites and leukocytes.  Troponin  I level was less than 0.5.  Liver associated enzymes were normal.   LABORATORY STUDIES:  Head CT scan showed no acute abnormalities with no  hydrocephalus and right ventricular catheter in place with no  complications.  Chest x-ray showed mild left lower lobe atelectasis with  COPD and cardiomegaly.  EKG had atrial fibrillation with slow  ventricular response,  left axis deviation, and lateral T-wave  inversions.   HOSPITAL COURSE:  The patient was admitted to a medical bed with  telemetry.  Her cardiac meds were held initially.  She was seen by a  Cardiology consultant, who recommended holding her Toprol-XL initially  and then later on in the hospital course, slowly restarting the  medication.  She was treated on antibiotics for urinary tract infection  with an antibiotic that was sensitive against her organism.  Gradually,  her condition improved.   PROCEDURES:  CT scan of the head.   COMPLICATIONS:  [name].   CONDITION ON DISCHARGE:  She is at her baseline mental status.  She  slept well and felt very good on the morning of discharge.  She had no  shortness of breath or cough or chest pain or nausea, dysuria, abdominal  pain, or diarrhea.   RECENT PHYSICAL EXAMINATION:  VITAL SIGNS:  Blood pressure of 151/86,  pulse 89, respirations 20, temperature 98.3, pulse oxygen saturation 95%  on room air.  GENERAL:  She is an elderly white female who is in no apparent distress  sitting in a chair.  HEAD, EYES, EARS, NOSE AND THROAT:  Within normal limits.  CHEST:  Clear to auscultation.  HEART:  An irregular rhythm with a systolic ejection murmur of grade 3/6  left sternal border.  ABDOMEN:  Benign.  EXTREMITIES:  Without edema.  NEUROLOGIC:  She is alert and oriented x3.  She had moderate decreased  hearing bilaterally.  She was able to walk with a walker independently.   DISCHARGE DIAGNOSES:  1. Delirium.  2. Acute pyelonephritis.  3. Mild Alzheimer disease.  4. Hydrocephalus, status post central nervous system shunt placement.  5. Critical aortic valve stenosis.  6. Chronic atrial fibrillation.  7. History of congestive heart failure due to diastolic dysfunction,      stable.  8. Severe bradycardia presumed secondary to medications.  9. Anxiety and depression.  10.Osteoarthritis.  11.Status post fall with left hip fracture and  surgical repair.   DISCHARGE MEDICATIONS:  1. Ciprofloxacin 250 mg 1 tablet p.o. b.i.d. for 5 days.  2. Toprol-XL 50 mg p.o. daily.  3. Lexapro 10 mg p.o. daily.  4. Xanax 0.5 mg p.o. nightly #30, refill 4.  5. Tylenol 500 mg p.o. t.i.d. p.r.n. arthritis pain.   SPECIAL RECOMMENDATIONS:  She was advised to check her body weights once  daily and to notify Dr. [name] if her weight increases 3 pounds from  today's weight.  Note that Darvocet, Norvasc, and Lasix were  discontinued.   FOLLOWUP PLANS:  She was advised to schedule a followup appointment with  Dr. Jarome Matin at [facility] in approximately 3  weeks following discharge.           ______________________________  Barry Dienes. [name], M.D.     DGP/MEDQ  D:  07/23/2008  T:  07/23/2008  Job:  [id number]   cc:   Cassell Clement, M.D.

## 2011-01-28 NOTE — Op Note (Signed)
   [name], [name]                         ACCOUNT NO.:  [id number]   MEDICAL RECORD NO.:  [id number]                   PATIENT TYPE:  INP   LOCATION:  3108                                 FACILITY:  [facility]   PHYSICIAN:  Jefry H. Pollyann Kennedy, M.D.                DATE OF BIRTH:  [birth year]   DATE OF PROCEDURE:  04/17/2003  DATE OF DISCHARGE:                                 OPERATIVE REPORT   PREOPERATIVE DIAGNOSIS:  Ventilator dependence.   POSTOPERATIVE DIAGNOSIS:  Ventilator dependence.   PROCEDURE:  Tracheostomy.   SURGEON:  Jefry H. Pollyann Kennedy, M.D.   General endotracheal anesthesia was used.  No complications.  No blood loss.   HISTORY:  This is an 77 year old who has been intubated for approximately  two weeks following an intracranial hemorrhage.  The risks, benefits,  alternatives, and complications of the procedure were explained to the  family, who seemed to understand and agreed to surgery.   DESCRIPTION OF PROCEDURE:  The patient was taken to the operating room and  placed on the operating table in a supine position.  Following the induction  of general endotracheal anesthesia via the previously-placed oral  endotracheal tube, the neck was prepped and draped in a standard fashion.  Electrocautery was used to incise the skin in a vertical fashion just above  the sternal notch.  Electrocautery dissection was used to dissect through  the superficial fascia and the strap muscle diastasis.  Straps were  reflected laterally.  The thyroid isthmus was superior to the first ring.  Fascia was cleaned off the upper trachea.  A tracheotomy was created between  the second and third ring, creating a lower flap and suturing the tracheal  ring flap to the cervical skin using a chromic suture.  A #8 cuffed Shiley  tracheostomy tube was inserted without difficulty, secured in place with  Velcro straps and nylon suture.  The patient was transferred back to ICU in  satisfactory  condition.                                               Jefry H. Pollyann Kennedy, M.D.    JHR/MEDQ  D:  04/18/2003  T:  04/18/2003  Job:  [id number]

## 2011-01-28 NOTE — Op Note (Signed)
NAMESTARIA, [name]                         ACCOUNT NO.:  [id number]   MEDICAL RECORD NO.:  [id number]                   PATIENT TYPE:  INP   LOCATION:  3108                                 FACILITY:  [facility]   PHYSICIAN:  Hewitt Shorts, M.D.            DATE OF BIRTH:  [birth year]   DATE OF PROCEDURE:  07/03/2003  DATE OF DISCHARGE:                                 OPERATIVE REPORT   PREOPERATIVE DIAGNOSIS:  Communicating hydrocephalus status post  subarachnoid and intraventricular hemorrhage.   POSTOPERATIVE DIAGNOSIS:  Communicating hydrocephalus status post  subarachnoid and intraventricular hemorrhage.   PROCEDURE:  Placement of right ventriculoperitoneal shunt with a  programmable Codman shunt.   SURGEON:  Hewitt Shorts, M.D.   ASSISTANT:  Stefani Dama, M.D.   ANESTHESIA:  General endotracheal anesthesia.   INDICATIONS FOR PROCEDURE:  The patient is an 77 year old woman who suffered  a subarachnoid hemorrhage from an intracranial aneurysm about three months  ago.  She had a prolonged hospital stay and did have an intraventricular  catheter in place.  However, she recovered, the catheter was removed, and  her ventricular size remained stable.  She underwent subsequent  rehabilitation and was discharged to home with her family.  At home, she was  noted to have increasing difficulty with walking and incontinence.  The  family became concerned about communicating hydrocephalus and the patient  was brought in for re-evaluation.  CT scan showed mild ventricular  enlargement, somewhat more prominent than sulcus enlargement.  The patient  underwent a lumbar puncture.  The pressure was noted to be normal, however,  after CSF drainage, the patient was noted to be improved in terms of her  function over the following day by her family.  Therefore, the decision was  made to proceed with placement of the ventriculoperitoneal shunt.   PROCEDURE:  The patient was brought  to the operating room and placed under  general endotracheal anesthesia.  The programmable valve was programmed to  an opening pressure of 90 mmHg.  The right sided scalp was shaved and then  the scalp, neck, chest and abdomen were prepped with Betadine solution and  draped in a sterile fashion.  A right parasagittal incision was made in the  right mid pupillary line overlying the coronal suture.  Dissection was  carried down to the pericranium which was split.  We then also opened the  right upper quadrant making a horizontal incision in the right upper  quadrant.  Both incisions were infiltrated with local anesthetic prior to  skin incision.  Dissection was carried down to the subcutaneous tissue to  the rectus fascia which was incised.  The muscle was split apart and the  posterior rectus fascia was identified.  This was carefully elevated and  opened and the tunnel cavity was identified.  A 3-0 silk purse-string suture  was placed and then using a shunt passer, we created  a subcutaneous tunnel  from the scalp to the abdomen with a single intervening incision behind the  right ear.  The catheter was flushed with saline solution and then passed  through the subcutaneous tunnel.  We then cut the ventricular catheter to a  length of 6.5 cm.  It was passed into the right frontal horn.  Good CSF flow  was noted.  It was connected with a right angle connector to the distal  portion of the catheter including the valve and peritoneal catheter.  These  connections were secured with 0 silk ties.  The CSF was seen to flow from  the distal end of the catheter.  It was cut to the appropriate length and  dropped into the abdominal cavity through the peritoneal opening and then  the purse-string suture was gently secured.  The wounds were closed in  multiple layers.  The cranial incisions were closed with interrupted  inverted 2-0 Vicryl sutures closing the galea and surgical staples closing  the skin.   It was dressed with Adaptic and sterile gauze.  The right  retroauricular incision was closed with interrupted inverted 2-0 undyed  Vicryl sutures and Dermabond.  The abdominal exposure was closed with the  anterior rectus fascia being closed with 2-0 undyed Vicryl sutures, the  subcutaneous and subcuticular layers closed with interrupted inverted 3-0  undyed Vicryl sutures and the skin edges were closed with Dermabond.  The  patient tolerated the procedure well.  Estimated blood loss 25 mL.  Sponge  counts were correct.  Following surgery, the patient was to be reversed from  the anesthetic to be transferred to the recovery room for further care.                                               Hewitt Shorts, M.D.    RWN/MEDQ  D:  07/03/2003  T:  07/03/2003  Job:  [id number]

## 2011-01-28 NOTE — Discharge Summary (Signed)
[name], [name]                         ACCOUNT NO.:  [id number]   MEDICAL RECORD NO.:  [id number]                   PATIENT TYPE:  IPS   LOCATION:  4028                                 FACILITY:  [facility]   PHYSICIAN:  Ranelle Oyster, M.D.             DATE OF BIRTH:  [birth year]   DATE OF ADMISSION:  05/09/2003  DATE OF DISCHARGE:  05/21/2003                                 DISCHARGE SUMMARY   DISCHARGE DIAGNOSES:  1. Subdural hematoma.  2. Bradycardia.  3. Tracheostomy removed.  4. Hypertension.  5. Peripheral vascular disease with peripheral neuropathy.  6. Proteus urinary tract infection.   PROCEDURES:  1. Status post coiling of subdural hematoma per Dr. Corliss Skains of radiology     services.  2. Intraventricular catheter, 04/06/03 per neurology services, Dr. Newell Coral.  3. Transvenous pacemaker per Dr. Verdis Prime, 04/05/03.   HISTORY OF PRESENT ILLNESS:  An 77 year old, right-handed white female with  history of chronic atrial fibrillation on Coumadin therapy, admitted 04/04/03  with acute onset of headache with nausea and vomiting.  Upon evaluation,  cranial CT scan with subarachnoid hemorrhage, received vitamin K to reverse  INR, with INR on admission 2.2.  Cerebral arteriogram,04/05/03, with coiling  of two aneurysms per Dr. Corliss Skains, 04/05/03.  Postoperative bradycardia  after receiving labetalol.  Underwent placement of femoral vein transvenous  pacemaker, 04/05/03, per Dr. Verdis Prime.  Follow-up cranial CT scan with  hydrocephalus, underwent right intraventricular catheter, 04/06/03, per Dr.  Newell Coral.  She was ventilatory dependent requiring tracheostomy, 04/17/03, per  Dr. Pollyann Kennedy and decannulated on 05/02/03.  Initial Panta tube feeds, diet later  advanced.  Findings of Proteus urinary tract infection, plan for  amoxicillin.  She was requiring minimal assist for ambulation.  Latest  chemistries unremarkable.   PAST MEDICAL HISTORY:  Hypertension, peripheral vascular  disease with  proximal disease per Dr. Waverly Ferrari of cardiothoracic surgery,  abdominal aortic aneurysm, history of migraine headaches.   ALLERGIES:  SULFA, CODEINE, MORPHINE.   HABITS:  Denies alcohol or tobacco.   PRIMARY MEDICAL DOCTOR:  Barry Dienes. [name], M.D.   CARDIOLOGIST:  Cassell Clement, M.D.   MEDICATIONS PRIOR TO ADMISSION WITH DOSES NOT MADE AVAILABLE:  1. Digoxin.  2. Coumadin.  3. Neurontin.  4. Norvasc.  5. Lisinopril.   SOCIAL HISTORY:  Lives alone in [place], independent prior to admission,  one-level home, daughter and local sister with good support.   HOSPITAL COURSE:  The patient with progressive gains while on rehab services  with therapies initiated on a b.i.d. basis.  The following issues were  followed during patient's rehab confused.  We drained [name]'s subdural  hematoma with coiling.  Postoperative hydrocephalus, intraventricular  catheter placement, 04/06/03, per Dr. Newell Coral.  Doing well.  Balance had  greatly improved.  Initial diplopia has since resolved.  She was needing  some cues for her safety.  No more bouts of bradycardia.  She has undergone  a transvenous pacemaker placement per Dr. Verdis Prime and monitored.  Blood  pressures controlled while she was Lanoxin with history of chronic atrial  fibrillation.  Coumadin had since been discontinued due to her subdural  hematoma.  Tracheostomy site was healing nicely.  Her Neurontin was adjusted  accordingly for her peripheral neuropathy.  She had completed a course of  amoxicillin for a Proteus urinary tract infection.  No bowel or bladder  disturbances.  She was having some intermittent  bouts of anxiety which were  controlled with Xanax.  Overall, for her functional mobility, she was close  supervision for bathing and dressing, supervision for toilet transfers,  ambulating greater than 500 feet with supervision.  Home health therapies  would be advised per rehab services with  home supervision by family.   Latest laboratory studies showed a sodium 135, potassium 4.1, BUN 13,  creatinine 0.9, hemoglobin 12, hematocrit 35.8.   DISCHARGE MEDICATIONS:  1. Lanoxin 0.125 mg daily.  2. Neurontin 200 mg three times daily.  3. Xanax 0.25 mg three times daily as needed.   ACTIVITY:  As tolerated, supervision for safety.   DIET:  Regular.   SPECIAL INSTRUCTIONS:  No drinking, no smoking, no driving.   FOLLOW UP:  1. Ranelle Oyster, M.D. at the outpatient rehab center as advised.  2. Barry Dienes. [name], M.D., medical management.  3. Hewitt Shorts, M.D., neurosurgery, [phone], call for appointment.      Mariam Dollar, P.A.                     Ranelle Oyster, M.D.    DA/MEDQ  D:  05/20/2003  T:  05/20/2003  Job:  [id number]   cc:   Sanjeev K. Corliss Skains, M.D.  [address]  [place]  [place]  [address]  Fax: [phone]   Ranelle Oyster, M.D.  [address] [place]  [place] [address]  Fax: [phone]   Cassell Clement, M.D.  [address]  [place]  [place] [address]  Fax: [phone]   Hewitt Shorts, M.D.  [address]  [place]  [place] [address]  Fax: [phone]   Barry Dienes. [name], M.D.  [address]  [place]  [place] [address]  Fax: [phone]

## 2011-01-28 NOTE — Discharge Summary (Signed)
[facility]  Patient:    [name], [name]                      MRN: [id number] Adm. Date:  [id number] Disc. Date: [id number] Attending:  Rudean Hitt CC:         Barry Dienes. [name], M.D.             Darden Palmer., M.D.                           Discharge Summary  FINAL DIAGNOSES: 1. Chest pain. 2. Coronary atherosclerosis. 3. Atrial fibrillation, chronic. 4. Long-term use of anticoagulants. 5. Hypertensive cardiovascular disease. 6. Situational depression. 7. Bronchitis. 8. Iliac artery occlusion. 9. Abdominal aortic aneurysm.  OPERATIONS PERFORMED:  Left heart cardiac catheterization and contrast aortogram by Dr. Darden Palmer., on Feb 10, 2000.  HISTORY:  This 77 year old female was admitted with nausea, vomiting, chest pain, diarrhea, dizziness, and labile blood pressure by Dr. Darden Palmer., out of emergency on Feb 07, 2000.  She has had a long history of paroxysmal atrial fibrillation with poorly controlled hypertension.  She was admitted by Dr. Clovis Pu. Brackbill to [facility] in June of 1997 with paroxysmal atrial fibrillation and labile blood pressure.  She was followed in Dr. Jenness Corner office until about 1998 and then lost to follow-up until March of 2001 when she was again seen by Dr. Patty Sermons and was under Dr. Barry Dienes. Patersons medical care.  She was on Coumadin and was in established atrial fibrillation.  She was mildly hypertensive.  An echocardiogram at that time showed perserved LV function with mitral annular calcification and mild aortic sclerosis without significant stenosis.  On Jan 21, 2000, she was seen a slow ventricular response and her Lanoxin dose was therefore reduced.  At that time, she was also complaining of some heaviness in the right side of her chest, dizziness, shortness of breath, and she was having difficulty sleeping at night and was anxious.  She has  had chronic dizziness.  She has been anxious and depressed, particularly since her husband died a year ago.  On the evening of admission, developed a tightness in her chest with pounding heart beat.  Became frightened and came to the emergency room with all of the above complaints.  CURRENT MEDICATIONS AT THE TIME OF ADMISSION:  Include Zestril, Coumadin, Lasix, and Celebrex.  PHYSICAL EXAMINATION:  VITAL SIGNS:  Showed a weight of 180, blood pressure was 180/90, pulse was 80 and irregularly irregular.  CHEST:  Clear.  HEART:  Revealed a grade 2-3/6 aortic ejection murmur with radiation to both subclavians and to the right carotid.  There was also a soft murmur of mitral regurgitation at the apex.  ABDOMEN:  Soft and nontender.  EXTREMITIES:  Show diminished pedal pulses in the right foot.  No femoral bruits noted.  HOSPITAL COURSE:  The patient was admitted to telemetry.  Coumadin was held in the anticipation of possible cardiac catheterization.  Her Coumadin, which had been well controlled under Dr. Yolonda Kida care, came back unexpectedly elevated on admission at 37 pro time and INR of 7.4.  Coumadin was therefore held.  Norvasc was added to her regimen and attempt to improve her blood pressure control.  She developed a cough and it was felt secondary to Altace, so we stopped it and switched to Cozaar.  Her two-dimensional  echocardiogram was done and showed mild aortic stenosis and mild mitral regurgitation.  She remained in atrial fibrillation.  It was felt that because of her recurrent chest pain, that she should have cardiac catheterization and Dr. Donnie Aho saw her for that.  The patient also on May 31 was treated for a productive cough with greenish-yellow sputum with Zithromax, which caused clinical improvement.  The patient underwent a cardiac catheterization on Feb 10, 2000.  It was done without complications.  Findings included a very mild aortic stenosis with  an eccentric valve.  Normal left ventricular function.  Mild coronary artery disease and she was also found to have, at the time of catheterization, an aortic aneurysm and an occluded right iliac.  It was felt from the standpoint of her coronary artery disease, that she would not require any intervention. Therefore, we restarted her Coumadin for her chronic atrial fibrillation.  By the day after catheterization, she was stable enough to be discharged home and plans were to get arterial Dopplers of her legs and an ultrasound of her abdominal aorta as an outpatient, to follow up on the aneurysm found by Dr. Donnie Aho.  DISCHARGE MEDICATIONS:  1. Humibid LA 600 one twice a day for chest congestion.  2. Zithromax Z-Pak as directed.  3. Vioxx 25 mg one daily.  4. Wellbutrin 150 mg one daily.  5. Zestril 20 mg daily.  6. Lasix 20 mg daily.  7. Lopressor 25 mg twice a day.  8. Norvasc 5 mg daily mg daily.  9. Coumadin 2 mg once a day starting June 2. 10. Tylenol p.r.n. 11. Tussionex 5 cc every 12 hours p.r.n. for cough. 12. Nitrostat 1/150 sublingually p.r.n.  ACTIVITY:  The patient is to walk as tolerated.  She is to avoid excessive bending in the leg because of the catheter.  DIET:  She will be on a low-salt diet.  DISCHARGE INSTRUCTIONS:  She is going to stop the Celebrex.  She is to see Dr. Patty Sermons in the office on Tuesday, June 5, for an office visit and pro time, and then set up outpatient Dopplers of the legs and abdominal ultrasound for the abdominal aortic aneurysm evaluation.  She will see Dr. Jarome Matin back for her regular visit as well.  CONDITION ON DISCHARGE:  Slightly improved. DD:  02/23/00 TD:  02/27/00 Job: [id number]

## 2011-01-28 NOTE — H&P (Signed)
NAME:  [name], [name]                         ACCOUNT NO.:  [id number]   MEDICAL RECORD NO.:  [id number]                   PATIENT TYPE:  INP   LOCATION:  3106                                 FACILITY:  [facility]   PHYSICIAN:  Hewitt Shorts, M.D.            DATE OF BIRTH:  [birth year]   DATE OF ADMISSION:  04/04/2003  DATE OF DISCHARGE:                                HISTORY & PHYSICAL   HISTORY OF PRESENT ILLNESS:  The patient is an 77 year old right-handed  white female with onset earlier this evening of sudden severe headache.  The  patient called her daughter and subsequently they called EMS and the patient  was brought to [facility] and evaluated by  Dr. Morene Crocker, the emergency room physician.  Workup included a CT scan  of the brain which revealed a subarachnoid hemorrhage, and a neurosurgical  consultation was requested.   The patient, prior to my assessment, was given Stadol 1 mg IV, Phenergan  12.5 mg IV, and Zofran 4 mg IV to treat her headaches and nausea and  vomiting.  She did vomit earlier; however, the medications significantly  sedated her, and according to the patient's daughter, her treating nurse,  and Dr. Marzetta Merino, the patient was more lethargic at the time of my evaluation  than prior to the medications.   The patient is though complaining of headache as well as nausea, and notably  she did vomit earlier.   PAST MEDICAL HISTORY:  1. History of hypertension, treated for the past four years.  2. History of atrial fibrillation, treated for the past four years.  3. History of peptic ulcer disease 15 years ago.  4. History of migraines years ago.   She has no history though of myocardial infarction, cancer, previous stroke,  diabetes, or lung disease.   PAST SURGICAL HISTORY:  Hysterectomy 40 years ago.   ALLERGIES:  No allergies to medications.   ADMISSION MEDICATIONS:  1. Digoxin.  2. Coumadin.  3. Neurontin.  4.  Norvasc.  5. Lisinopril.  The dosages of these medications are unknown.   FAMILY HISTORY:  Mother died in her 70s from breast cancer.  Father died in  his 30s from cancer.   SOCIAL HISTORY:  The patient is widowed.  She lives in [place].  She has  a daughter and two sons.  She is accompanied by her daughter, [name].  [place]  She quit smoking 25 years ago.  She does not drink alcoholic beverages.   REVIEW OF SYSTEMS:  Notable for those difficulties described in her history  of present illness and past medical history.  It is otherwise unremarkable,  although extensive review of systems was limited due to her lethargy.   PHYSICAL EXAMINATION:  GENERAL:  The patient is an elderly white female who  is lethargic but in no acute distress.  VITAL  SIGNS:  Temperature 97, pulse 69, blood pressure 164/96, respiratory  rate 18.  LUNGS:  Mild rhonchi.  Her respiratory movement is symmetrical.  HEART:  Irregular irregular rhythm with normal S1 and S2.  There is a 2 to  3/6 systolic murmur.  ABDOMEN:  Soft, nontender, nondistended.  Bowel sounds are present.  NECK:  There is 2+ meningismus.  EXTREMITIES:  No clubbing, cyanosis or edema.  NEUROLOGIC:  Mental status:  The patient is lethargic but opens her eyes to  voice.  She is oriented to her name, Jackson [place], and the seventh  month in 2004.  She follows commands.  Her speech is fluent.  She has good  comprehension.  Cranial nerves:  Pupils are 1.5 mm in diameter, round and  reactive to light.  Extraocular movements are intact.  Her facial movement  is symmetrical.  Hearing is present.  Tongue is in the midline.  Motor  examination shows that she has good strength of all four extremities.  She  has no drift to the upper extremities.  Sensation is intact to pinprick.  Reflexes are symmetrical.  Gait and stance were not tested.   LABORATORY DATA:  CT scan of the brain revealed subarachnoid  hemorrhage and  possibly a small amount of intraventricular hemorrhage.  Subarachnoid  hemorrhage appears in the interhemispheric fissure, the left sylvian  fissure, and in the basilar cisterns.  There may be a small amount of  intraventricular hemorrhage in the posterior aspect of the third ventricle.  There is no hydrocephalus seen.  Laboratories revealed a PT of 20 seconds  and an INR of 2.2.  CBC and BMET were unremarkable.   IMPRESSION:  1. Subarachnoid hemorrhage, grade 3, probably aneurysmal.  2. Anticoagulation (therapeutic) secondary to Coumadin.  3. Hypertension.  4. Atrial fibrillation.   PLAN:  The patient will be admitted to the neurosurgical intensive care unit  for ongoing care, including neuro checks.  She will be given fresh frozen  plasma and vitamin K to reverse her anticoagulation.  Once those are  sufficiently reversed, they will proceed with arteriography.  If an aneurysm  is demonstrated and there is no contraindication, the aneurysm will be  coiled endovascularly.   I have had an opportunity to speak with the patient's daughter, [name], as well as the patient's grandson about her condition, our  recommendations for treatment, her risks and prognosis, and their questions  regarding these matters were answered for them.                                                Hewitt Shorts, M.D.    RWN/MEDQ  D:  04/05/2003  T:  04/05/2003  Job:  [id number]   cc:   Barry Dienes. [name], M.D.  [address]  [place]  [place] [address]  Fax: [phone]   Cassell Clement, M.D.  [address]  [place]  [place] [address]  Fax: [phone]

## 2011-01-28 NOTE — H&P (Signed)
[place]  Patient:    [name], [name]                      MRN: [id number] Adm. Date:  [id number] Attending:  Rudean Hitt CC:         Barry Dienes. [name], M.D.             Thomas A. Patty Sermons, M.D.                         History and Physical  CHIEF COMPLAINT: Nausea, vomiting, diarrhea, chest pain, dizziness, labile blood pressure.  HISTORY OF PRESENT ILLNESS: The patient is a 77 year old female who presented to the emergency room, having been brought by EMS.  She has a long history of paroxysmal atrial fibrillation, uncontrolled hypertension.  She was amiodarone by Dr. Patty Sermons to [facility] in June 1997 with paroxysmal atrial fibrillation and some labile blood pressure.  She was followed by Dr. Patty Sermons until around 1998 and blood pressure was noted to be labile during that time.  She also had had some arthritis treated with prednisone during that time.  She has been under Dr. Sherrlyn Hock care and was not seen by Dr. Patty Sermons until March 2001, when it was noted that she had established with atrial fibrillation.  She was mildly hypertensive.  An echocardiogram at that time showed preserved LV function, mitral annular calcification, and mild aortic sclerosis.  She was seen by Dr. Patty Sermons on Jan 21, 2000 with findings of some slow ventricular response and Lanoxin was reduced.  At that time she was complaining of heaviness in the right side of her chest, dizziness, and shortness of breath in a situation that her heart is pounding.  She sleeps very little at night and sits up on the edge of the bed at night and tends to be anxious.  She complains of chronic dizziness and finds it difficult to get out because she is worried that her blood pressure will go up and down.  Her daughter, who lives near her, states that she checks her mothers blood pressure every hour and monitors her frequently.  It is pertinent that  the patients husband died about a year ago and things have been fairly tense at home.  She became tearful when talking about his death the evening of admission.  Three days prior to admission she developed some nausea and vomiting, which has been present on and off through the weekend.  She says that she has felt bad and complains of dizziness, had some diarrhea, and noted some pounding heart beat, and that her blood pressure would shoot up and down. This afternoon she developed a tight feeling in her chest with pounding heart beat, dizziness, became frightened, and came to the emergency room with all the above complaints.  She is very inactive and it is difficult to assess how much tightness she has on a regular basis.  PAST MEDICAL HISTORY:  1. Chronic labile hypertension.  2. Known hyperlipidemia in the past.  3. Chronic atrial fibrillation, currently on Coumadin.  4. She reports having had Doppler studies in the past under various care.  5. Evidently borderline blood sugars noted.  ALLERGIES:  1. Intolerant to CODEINE.  2. Intolerant to ASPIRIN.  PAST SURGICAL HISTORY:  1. Appendectomy.  2. Hysterectomy.  CURRENT MEDICATIONS:  1. Zestril.  2. Coumadin.  3. Lasix.  4. Celebrex.  She does not list  Lanoxin currently and states that she was taken off of some medicine by Dr. [name].  FAMILY HISTORY: Mother died at age 11 of cancer.  Father died of heart trouble at age 22.  She is one of 12 children, most died of cancer.  SOCIAL HISTORY: The patients husband died at year ago.  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  She quit smoking in the mid 1990s.  She does not use alcohol to excess.  She has a daughter that lives near her and has three other children.  She is still grieving over her husbands death previously.  REVIEW OF SYSTEMS: Diffusely positive.  She has intermittent skin problems. She complains of significant dizziness, intermittent  headaches with pounding head, pounding heart rate.  She has had some dizziness and has episodic vertigo that she complains of.  Some mild decreased hearing.  No difficulty swallowing.  Occasional indigestion, nausea and vomiting as noted above. Diarrhea as noted above.  Occasional frequency and polyuria.  She has significant arthritis and has seen Dr. Kellie Simmering in the past for this.  She has a history of nonbleeding ulcers and has been on Zantac previously.  She has significant arthritis and is currently taking Celebrex.  No definite claudication.  The remainder of the Review Of Systems is unremarkable except as noted above.  PHYSICAL EXAMINATION:  VITAL SIGNS: Weight 180 pounds.  Blood pressure 180/90, pulse currently 70-80 and irregularly irregular.  SKIN: Warm and dry.  HEENT: No hemorrhages.  PERRL.  Fundi difficult to see in the emergency room. Pharynx negative.  NECK: Supple without masses.  There is a transmitted murmur versus bruit in the right carotid.  There are bilateral subclavian bruits versus transmitted murmur.  There is a 2-3/6 aortic ejection murmur with radiation to both subclavians and the right carotid.  There is a soft systolic murmur of mitral regurgitation at the apex.  LYMPH NODES: Unremarkable.  BREAST: Not examined.  ABDOMEN: Soft, nontender, no organomegaly or masses noted.  EXTREMITIES: Femoral pulses are 2+, pedal pulses are somewhat diminished in the right foot and present in the left foot.  No femoral bruits noted. Moderate venous varicosities noted bilaterally.  NEUROLOGIC: Normal except the patient appeared to be somewhat anxious and depressed.  LABORATORY DATA: Her 12 lead ECG shows atrial fibrillation with a controlled response.  INR was 7.4, pro time 37 seconds.  CBC and chemistry panel normal.  IMPRESSION:  1. Prolonged episode of chest tightness, rule out unstable angina or     myocardial infarction.  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  Hold Coumadin.  3. Labile hypertension.   4. Chronic atrial fibrillation, on Coumadin therapy.  5. Anxiety and depression, possible delayed grief reaction.  6. Murmur of aortic sclerosis.  RECOMMENDATIONS: Admit to Dr. Patty Sermons.  Hold Coumadin at the present time. Add Norvasc for blood pressure control in view of slow response in the past. Further work-up by Dr. Patty Sermons. DD:  02/08/00 TD:  02/08/00 Job: [id number]

## 2011-06-01 LAB — BASIC METABOLIC PANEL
CO2: 32
Calcium: 9.3
Creatinine, Ser: 1.4 — ABNORMAL HIGH
GFR calc Af Amer: 43 — ABNORMAL LOW

## 2011-06-10 LAB — DIFFERENTIAL
Basophils Absolute: 0.2 — ABNORMAL HIGH
Basophils Relative: 1
Basophils Relative: 1
Eosinophils Absolute: 1.2 — ABNORMAL HIGH
Eosinophils Absolute: 1.3 — ABNORMAL HIGH
Eosinophils Relative: 12 — ABNORMAL HIGH
Lymphocytes Relative: 10 — ABNORMAL LOW
Lymphocytes Relative: 4 — ABNORMAL LOW
Lymphs Abs: 1.3
Lymphs Abs: 1.7
Lymphs Abs: 1.8
Monocytes Relative: 4
Monocytes Relative: 4
Neutro Abs: 10.3 — ABNORMAL HIGH
Neutrophils Relative %: 67
Neutrophils Relative %: 80 — ABNORMAL HIGH

## 2011-06-10 LAB — COMPREHENSIVE METABOLIC PANEL
ALT: 12
ALT: 14
AST: 12
AST: 15
Albumin: 2.9 — ABNORMAL LOW
Albumin: 3.5
Alkaline Phosphatase: 51
Alkaline Phosphatase: 53
Alkaline Phosphatase: 56
Alkaline Phosphatase: 60
BUN: 12
BUN: 17
BUN: 21
CO2: 27
CO2: 28
CO2: 28
Calcium: 8.5
Calcium: 9
Chloride: 100
Chloride: 105
Creatinine, Ser: 1.17
Creatinine, Ser: 1.19
GFR calc Af Amer: 60 — ABNORMAL LOW
GFR calc non Af Amer: 43 — ABNORMAL LOW
GFR calc non Af Amer: 49 — ABNORMAL LOW
GFR calc non Af Amer: 50 — ABNORMAL LOW
Glucose, Bld: 105 — ABNORMAL HIGH
Glucose, Bld: 99
Potassium: 3.4 — ABNORMAL LOW
Potassium: 3.9
Sodium: 140
Sodium: 142
Total Bilirubin: 0.6
Total Bilirubin: 1.2
Total Protein: 6.3

## 2011-06-10 LAB — CBC
HCT: 33.6 — ABNORMAL LOW
HCT: 36.5
HCT: 38.4
Hemoglobin: 11.5 — ABNORMAL LOW
Hemoglobin: 12.8
MCHC: 33.3
MCHC: 34.2
MCV: 96.5
MCV: 97
Platelets: 250
Platelets: 294
RBC: 3.75 — ABNORMAL LOW
RBC: 3.78 — ABNORMAL LOW
RBC: 3.89
RDW: 13
WBC: 15.5 — ABNORMAL HIGH
WBC: 9.8

## 2011-06-10 LAB — URINALYSIS, ROUTINE W REFLEX MICROSCOPIC
Bilirubin Urine: NEGATIVE
Hgb urine dipstick: NEGATIVE
Protein, ur: NEGATIVE
Urobilinogen, UA: 0.2

## 2011-06-10 LAB — POCT CARDIAC MARKERS
CKMB, poc: <1 — ABNORMAL LOW
Myoglobin, poc: 95.2
Troponin i, poc: <0.05

## 2011-06-10 LAB — PROTIME-INR
INR: 1
Prothrombin Time: 13.9

## 2011-06-10 LAB — B-NATRIURETIC PEPTIDE (CONVERTED LAB)
Pro B Natriuretic peptide (BNP): 211 — ABNORMAL HIGH
Pro B Natriuretic peptide (BNP): 461 — ABNORMAL HIGH

## 2011-06-10 LAB — URINE MICROSCOPIC-ADD ON

## 2011-06-13 LAB — BASIC METABOLIC PANEL
BUN: 22
BUN: 26 — ABNORMAL HIGH
BUN: 28 — ABNORMAL HIGH
CO2: 25
CO2: 25
Calcium: 8.6
Calcium: 8.7
Calcium: 8.9
Chloride: 105
Chloride: 106
Chloride: 107
Creatinine, Ser: 1.06
Creatinine, Ser: 1.07
Creatinine, Ser: 1.38 — ABNORMAL HIGH
Creatinine, Ser: 1.73 — ABNORMAL HIGH
GFR calc Af Amer: 44 — ABNORMAL LOW
GFR calc Af Amer: 60 — ABNORMAL LOW
GFR calc non Af Amer: 36 — ABNORMAL LOW
Glucose, Bld: 100 — ABNORMAL HIGH
Glucose, Bld: 107 — ABNORMAL HIGH

## 2011-06-13 LAB — PROTIME-INR
INR: 1.5
INR: 2 — ABNORMAL HIGH
INR: 2.6 — ABNORMAL HIGH
INR: 2.6 — ABNORMAL HIGH
INR: 1.1
Prothrombin Time: 18.9 — ABNORMAL HIGH
Prothrombin Time: 29.2 — ABNORMAL HIGH
Prothrombin Time: 29.6 — ABNORMAL HIGH

## 2011-06-13 LAB — COMPREHENSIVE METABOLIC PANEL
ALT: 8
AST: 11
Albumin: 3.2 — ABNORMAL LOW
Alkaline Phosphatase: 88
Calcium: 8.3 — ABNORMAL LOW
GFR calc Af Amer: 54 — ABNORMAL LOW
Potassium: 3.4 — ABNORMAL LOW
Sodium: 137
Total Protein: 6.6

## 2011-06-13 LAB — URINALYSIS, ROUTINE W REFLEX MICROSCOPIC
Glucose, UA: NEGATIVE
Glucose, UA: NEGATIVE
Hgb urine dipstick: NEGATIVE
Ketones, ur: 15 — AB
Protein, ur: NEGATIVE
Specific Gravity, Urine: 1.015
Urobilinogen, UA: 1
Urobilinogen, UA: 0.2
pH: 6

## 2011-06-13 LAB — URINE MICROSCOPIC-ADD ON

## 2011-06-13 LAB — CBC
HCT: 31.4 — ABNORMAL LOW
MCHC: 33
MCHC: 33.6
MCHC: 33.9
MCHC: 32.8
MCV: 95.3
MCV: 95.6
MCV: 97.8
Platelets: 166
Platelets: 178
Platelets: 192
RBC: 3.35 — ABNORMAL LOW
RBC: 3.79 — ABNORMAL LOW
RBC: 3.61 — ABNORMAL LOW
RDW: 14.1
RDW: 14.3
RDW: 14.6
RDW: 15.2
WBC: 10.8 — ABNORMAL HIGH

## 2011-06-13 LAB — DIFFERENTIAL
Eosinophils Absolute: 0.2
Eosinophils Relative: 3
Lymphs Abs: 2.1
Monocytes Absolute: 0.8
Monocytes Relative: 11

## 2011-06-13 LAB — CROSSMATCH

## 2011-06-13 LAB — TSH
TSH: 2.897
TSH: 3.117

## 2011-06-13 LAB — POCT CARDIAC MARKERS: Myoglobin, poc: 65.9

## 2011-06-13 LAB — CULTURE, BLOOD (ROUTINE X 2): Culture: NO GROWTH

## 2011-06-13 LAB — URINE CULTURE: Colony Count: >=100000

## 2011-06-13 LAB — MAGNESIUM: Magnesium: 1.9

## 2011-06-13 LAB — B-NATRIURETIC PEPTIDE (CONVERTED LAB): Pro B Natriuretic peptide (BNP): 479 — ABNORMAL HIGH

## 2011-06-14 LAB — CARDIAC PANEL(CRET KIN+CKTOT+MB+TROPI)
CK, MB: 1.1
CK, MB: 2.5
Relative Index: 2
Relative Index: 2.4
Troponin I: 0.01

## 2011-06-14 LAB — COMPREHENSIVE METABOLIC PANEL
ALT: 15
Alkaline Phosphatase: 89
BUN: 8
CO2: 26
GFR calc non Af Amer: >60
Glucose, Bld: 91
Potassium: 3.4 — ABNORMAL LOW
Sodium: 138
Total Bilirubin: 1.8 — ABNORMAL HIGH
Total Protein: 6.9

## 2011-06-14 LAB — BASIC METABOLIC PANEL
CO2: 29
Chloride: 103
GFR calc Af Amer: >60
Potassium: 3.3 — ABNORMAL LOW
Sodium: 137

## 2011-06-14 LAB — B-NATRIURETIC PEPTIDE (CONVERTED LAB): Pro B Natriuretic peptide (BNP): 666 — ABNORMAL HIGH

## 2011-06-14 LAB — CBC
HCT: 34.6 — ABNORMAL LOW
Hemoglobin: 11.7 — ABNORMAL LOW
Hemoglobin: 11.9 — ABNORMAL LOW
MCHC: 33.4
MCHC: 33.7
MCV: 94.2
MCV: 94.5
Platelets: 256
RBC: 3.66 — ABNORMAL LOW
RBC: 3.78 — ABNORMAL LOW
RBC: 3.82 — ABNORMAL LOW
RDW: 15
WBC: 9.2
WBC: 9.7

## 2011-06-15 LAB — CK TOTAL AND CKMB (NOT AT ARMC)
CK, MB: 2.3
Total CK: 72

## 2011-06-15 LAB — BASIC METABOLIC PANEL
BUN: 25 — ABNORMAL HIGH
GFR calc Af Amer: 41 — ABNORMAL LOW
GFR calc non Af Amer: 34 — ABNORMAL LOW
Potassium: 3.4 — ABNORMAL LOW
Sodium: 141

## 2011-06-15 LAB — CBC
HCT: 28.7 — ABNORMAL LOW
HCT: 42.6
Hemoglobin: 14.3
Hemoglobin: 9.7 — ABNORMAL LOW
Platelets: 203
Platelets: 213
RBC: 2.95 — ABNORMAL LOW
RBC: 4.4
RDW: 12.6
RDW: 12.8
WBC: 10.1
WBC: 12.5 — ABNORMAL HIGH
WBC: 14.7 — ABNORMAL HIGH

## 2011-06-15 LAB — COMPREHENSIVE METABOLIC PANEL
AST: 22
Albumin: 3.9
Alkaline Phosphatase: 72
BUN: 30 — ABNORMAL HIGH
Chloride: 98
GFR calc Af Amer: 37 — ABNORMAL LOW
Potassium: 2.9 — ABNORMAL LOW
Total Bilirubin: 1.2
Total Protein: 7.6

## 2011-06-15 LAB — URINALYSIS, ROUTINE W REFLEX MICROSCOPIC
Glucose, UA: NEGATIVE
Ketones, ur: NEGATIVE
Nitrite: NEGATIVE
Specific Gravity, Urine: 1.016
pH: 5.5

## 2011-06-15 LAB — TYPE AND SCREEN: Antibody Screen: NEGATIVE

## 2011-06-15 LAB — APTT: aPTT: 32

## 2011-06-15 LAB — DIFFERENTIAL
Basophils Relative: 0
Eosinophils Relative: 0
Lymphocytes Relative: 6 — ABNORMAL LOW
Monocytes Absolute: 0.8
Monocytes Relative: 5
Neutro Abs: 13 — ABNORMAL HIGH

## 2011-06-15 LAB — TROPONIN I: Troponin I: 0.02

## 2011-06-15 LAB — ABO/RH: ABO/RH(D): A POS

## 2011-06-17 LAB — BASIC METABOLIC PANEL
BUN: 15
BUN: 18
CO2: 28
Calcium: 8.8
Chloride: 101
Creatinine, Ser: 1.22 — ABNORMAL HIGH
GFR calc non Af Amer: 42 — ABNORMAL LOW
Glucose, Bld: 103 — ABNORMAL HIGH
Glucose, Bld: 109 — ABNORMAL HIGH
Potassium: 3.8

## 2011-06-17 LAB — DIFFERENTIAL
Basophils Absolute: 0
Eosinophils Relative: 0
Lymphocytes Relative: 6 — ABNORMAL LOW
Lymphs Abs: 1
Monocytes Absolute: 0.5
Monocytes Relative: 3
Neutro Abs: 14.8 — ABNORMAL HIGH

## 2011-06-17 LAB — COMPREHENSIVE METABOLIC PANEL
AST: 22
Albumin: 3.9
BUN: 18
Calcium: 9.7
Chloride: 101
Creatinine, Ser: 1.07
GFR calc Af Amer: 59 — ABNORMAL LOW
Total Protein: 7.8

## 2011-06-17 LAB — CK TOTAL AND CKMB (NOT AT ARMC): CK, MB: 3.4

## 2011-06-17 LAB — URINE MICROSCOPIC-ADD ON

## 2011-06-17 LAB — URINALYSIS, ROUTINE W REFLEX MICROSCOPIC
Glucose, UA: NEGATIVE
Protein, ur: NEGATIVE
Specific Gravity, Urine: 1.012

## 2011-06-17 LAB — CBC
HCT: 39.9
MCV: 94.2
Platelets: 278
RDW: 14.1
WBC: 16.3 — ABNORMAL HIGH

## 2011-06-17 LAB — URINE CULTURE
Colony Count: >=100000
Special Requests: NEGATIVE

## 2011-06-17 LAB — APTT: aPTT: 29

## 2011-06-24 LAB — DIFFERENTIAL
Basophils Absolute: 0.1
Basophils Relative: 1
Lymphocytes Relative: 13
Neutro Abs: 10.9 — ABNORMAL HIGH
Neutrophils Relative %: 80 — ABNORMAL HIGH

## 2011-06-24 LAB — BASIC METABOLIC PANEL
CO2: 29
Calcium: 9.7
Creatinine, Ser: 1.07
GFR calc Af Amer: 59 — ABNORMAL LOW
GFR calc non Af Amer: 49 — ABNORMAL LOW
Glucose, Bld: 95

## 2011-06-24 LAB — CBC
MCHC: 34.3
RBC: 4.17
RDW: 12.6

## 2011-06-24 LAB — URINALYSIS, ROUTINE W REFLEX MICROSCOPIC
Glucose, UA: NEGATIVE
Ketones, ur: NEGATIVE
Nitrite: NEGATIVE
pH: 7.5

## 2011-06-24 LAB — URINE CULTURE: Colony Count: >=100000

## 2011-06-24 LAB — URINE MICROSCOPIC-ADD ON

## 2012-10-13 DEATH — deceased

## 2018-10-22 ENCOUNTER — Telehealth: Payer: Self-pay | Admitting: Pediatrics

## 2018-10-24 NOTE — Telephone Encounter (Signed)
error
